# Patient Record
Sex: Female | Born: 2000 | Hispanic: Yes | Smoking: Never smoker
Health system: Southern US, Community
[De-identification: ages and names within clinical notes are randomized; demographics above are authoritative.]

## PROBLEM LIST (undated history)

## (undated) DIAGNOSIS — Z975 Presence of (intrauterine) contraceptive device: Secondary | ICD-10-CM

## (undated) DIAGNOSIS — Z3046 Encounter for surveillance of implantable subdermal contraceptive: Secondary | ICD-10-CM

## (undated) DIAGNOSIS — N898 Other specified noninflammatory disorders of vagina: Secondary | ICD-10-CM

## (undated) DIAGNOSIS — N926 Irregular menstruation, unspecified: Secondary | ICD-10-CM

## (undated) DIAGNOSIS — N930 Postcoital and contact bleeding: Secondary | ICD-10-CM

## (undated) DIAGNOSIS — J45909 Unspecified asthma, uncomplicated: Secondary | ICD-10-CM

## (undated) DIAGNOSIS — Z30017 Encounter for initial prescription of implantable subdermal contraceptive: Secondary | ICD-10-CM

## (undated) DIAGNOSIS — O139 Gestational [pregnancy-induced] hypertension without significant proteinuria, unspecified trimester: Secondary | ICD-10-CM

## (undated) DIAGNOSIS — N760 Acute vaginitis: Secondary | ICD-10-CM

## (undated) DIAGNOSIS — L309 Dermatitis, unspecified: Secondary | ICD-10-CM

## (undated) DIAGNOSIS — L0292 Furuncle, unspecified: Secondary | ICD-10-CM

## (undated) DIAGNOSIS — B9689 Other specified bacterial agents as the cause of diseases classified elsewhere: Secondary | ICD-10-CM

## (undated) DIAGNOSIS — T1490XA Injury, unspecified, initial encounter: Secondary | ICD-10-CM

## (undated) DIAGNOSIS — Z309 Encounter for contraceptive management, unspecified: Secondary | ICD-10-CM

## (undated) DIAGNOSIS — A749 Chlamydial infection, unspecified: Secondary | ICD-10-CM

## (undated) HISTORY — DX: Chlamydial infection, unspecified: A74.9

## (undated) HISTORY — DX: Encounter for surveillance of implantable subdermal contraceptive: Z30.46

## (undated) HISTORY — DX: Furuncle, unspecified: L02.92

## (undated) HISTORY — DX: Irregular menstruation, unspecified: N92.6

## (undated) HISTORY — DX: Encounter for initial prescription of implantable subdermal contraceptive: Z30.017

## (undated) HISTORY — DX: Postcoital and contact bleeding: N93.0

## (undated) HISTORY — DX: Other specified noninflammatory disorders of vagina: N89.8

## (undated) HISTORY — PX: APPENDECTOMY: SHX54

## (undated) HISTORY — DX: Encounter for contraceptive management, unspecified: Z30.9

## (undated) HISTORY — DX: Presence of (intrauterine) contraceptive device: Z97.5

## (undated) HISTORY — DX: Gestational (pregnancy-induced) hypertension without significant proteinuria, unspecified trimester: O13.9

## (undated) HISTORY — DX: Other specified bacterial agents as the cause of diseases classified elsewhere: B96.89

## (undated) HISTORY — DX: Injury, unspecified, initial encounter: T14.90XA

## (undated) HISTORY — DX: Acute vaginitis: N76.0

---
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
## 2018-01-20 ENCOUNTER — Ambulatory Visit (INDEPENDENT_AMBULATORY_CARE_PROVIDER_SITE_OTHER): Payer: Medicaid Other | Admitting: Advanced Practice Midwife

## 2018-01-20 ENCOUNTER — Encounter: Payer: Self-pay | Admitting: Advanced Practice Midwife

## 2018-01-20 ENCOUNTER — Other Ambulatory Visit: Payer: Medicaid Other

## 2018-01-20 VITALS — BP 122/81 | HR 99 | Wt 182.0 lb

## 2018-01-20 DIAGNOSIS — Z3A27 27 weeks gestation of pregnancy: Secondary | ICD-10-CM | POA: Diagnosis not present

## 2018-01-20 DIAGNOSIS — Z3402 Encounter for supervision of normal first pregnancy, second trimester: Secondary | ICD-10-CM

## 2018-01-20 DIAGNOSIS — Z23 Encounter for immunization: Secondary | ICD-10-CM

## 2018-01-20 DIAGNOSIS — Z331 Pregnant state, incidental: Secondary | ICD-10-CM

## 2018-01-20 DIAGNOSIS — Z1389 Encounter for screening for other disorder: Secondary | ICD-10-CM

## 2018-01-20 LAB — POCT URINALYSIS DIPSTICK OB
Blood, UA: NEGATIVE
Glucose, UA: NEGATIVE
Ketones, UA: NEGATIVE
Leukocytes, UA: NEGATIVE
Nitrite, UA: NEGATIVE

## 2018-01-20 NOTE — Patient Instructions (Signed)
Leah Carroll, I greatly value your feedback.  If you receive a survey following your visit with us today, we appreciate you taking the time to fill it out.  Thanks, Cathie BeamsFran Cresenzo-Dishmon, CNM   Call the office [phone]) or go to [facility] if:  You begin to have strong, frequent contractions  Your water breaks.  Sometimes it is a big gush of fluid, sometimes it is just a trickle that keeps getting your panties wet or running down your legs  You have vaginal bleeding.  It is normal to have a small amount of spotting if your cervix was checked.   You don't feel your baby moving like normal.  If you don't, get you something to eat and drink and lay down and focus on feeling your baby move.  You should feel at least 10 movements in 2 hours.  If you don't, you should call the office or go to [facility].    Tdap Vaccine  It is recommended that you get the Tdap vaccine during the third trimester of EACH pregnancy to help protect your baby from getting pertussis (whooping cough)  27-36 weeks is the BEST time to do this so that you can pass the protection on to your baby. During pregnancy is better than after pregnancy, but if you are unable to get it during pregnancy it will be offered at the hospital.   You will be offered this vaccine in the office after 27 weeks. If you do not have health insurance, you can get this vaccine at the health department or your family doctor  Everyone who will be around your baby should also be up-to-date on their vaccines. Adults (who are not pregnant) only need 1 dose of Tdap during adulthood.   Third Trimester of Pregnancy The third trimester is from week 29 through week 42, months 7 through 9. The third trimester is a time when the fetus is growing rapidly. At the end of the ninth month, the fetus is about 20 inches in length and weighs 6-10 pounds.  BODY CHANGES Your body goes through many changes during pregnancy. The changes vary from woman  to woman.   Your weight will continue to increase. You can expect to gain 25-35 pounds (11-16 kg) by the end of the pregnancy.  You may begin to get stretch marks on your hips, abdomen, and breasts.  You may urinate more often because the fetus is moving lower into your pelvis and pressing on your bladder.  You may develop or continue to have heartburn as a result of your pregnancy.  You may develop constipation because certain hormones are causing the muscles that push waste through your intestines to slow down.  You may develop hemorrhoids or swollen, bulging veins (varicose veins).  You may have pelvic pain because of the weight gain and pregnancy hormones relaxing your joints between the bones in your pelvis. Backaches may result from overexertion of the muscles supporting your posture.  You may have changes in your hair. These can include thickening of your hair, rapid growth, and changes in texture. Some women also have hair loss during or after pregnancy, or hair that feels dry or thin. Your hair will most likely return to normal after your baby is born.  Your breasts will continue to grow and be tender. A yellow discharge may leak from your breasts called colostrum.  Your belly button may stick out.  You may feel short of breath because of your expanding uterus.  You may notice the fetus "dropping," or moving lower in your abdomen.  You may have a bloody mucus discharge. This usually occurs a few days to a week before labor begins.  Your cervix becomes thin and soft (effaced) near your due date. WHAT TO EXPECT AT YOUR PRENATAL EXAMS  You will have prenatal exams every 2 weeks until week 36. Then, you will have weekly prenatal exams. During a routine prenatal visit:  You will be weighed to make sure you and the fetus are growing normally.  Your blood pressure is taken.  Your abdomen will be measured to track your baby's growth.  The fetal heartbeat will be listened  to.  Any test results from the previous visit will be discussed.  You may have a cervical check near your due date to see if you have effaced. At around 36 weeks, your caregiver will check your cervix. At the same time, your caregiver will also perform a test on the secretions of the vaginal tissue. This test is to determine if a type of bacteria, Group B streptococcus, is present. Your caregiver will explain this further. Your caregiver may ask you:  What your birth plan is.  How you are feeling.  If you are feeling the baby move.  If you have had any abnormal symptoms, such as leaking fluid, bleeding, severe headaches, or abdominal cramping.  If you have any questions. Other tests or screenings that may be performed during your third trimester include:  Blood tests that check for low iron levels (anemia).  Fetal testing to check the health, activity level, and growth of the fetus. Testing is done if you have certain medical conditions or if there are problems during the pregnancy. FALSE LABOR You may feel small, irregular contractions that eventually go away. These are called Braxton Hicks contractions, or false labor. Contractions may last for hours, days, or even weeks before true labor sets in. If contractions come at regular intervals, intensify, or become painful, it is best to be seen by your caregiver.  SIGNS OF LABOR   Menstrual-like cramps.  Contractions that are 5 minutes apart or less.  Contractions that start on the top of the uterus and spread down to the lower abdomen and back.  A sense of increased pelvic pressure or back pain.  A watery or bloody mucus discharge that comes from the vagina. If you have any of these signs before the 37th week of pregnancy, call your caregiver right away. You need to go to the hospital to get checked immediately. HOME CARE INSTRUCTIONS   Avoid all smoking, herbs, alcohol, and unprescribed drugs. These chemicals affect the  formation and growth of the baby.  Follow your caregiver's instructions regarding medicine use. There are medicines that are either safe or unsafe to take during pregnancy.  Exercise only as directed by your caregiver. Experiencing uterine cramps is a good sign to stop exercising.  Continue to eat regular, healthy meals.  Wear a good support bra for breast tenderness.  Do not use hot tubs, steam rooms, or saunas.  Wear your seat belt at all times when driving.  Avoid raw meat, uncooked cheese, cat litter boxes, and soil used by cats. These carry germs that can cause birth defects in the baby.  Take your prenatal vitamins.  Try taking a stool softener (if your caregiver approves) if you develop constipation. Eat more high-fiber foods, such as fresh vegetables or fruit and whole grains. Drink plenty of fluids to keep your urine  clear or pale yellow.  Take warm sitz baths to soothe any pain or discomfort caused by hemorrhoids. Use hemorrhoid cream if your caregiver approves.  If you develop varicose veins, wear support hose. Elevate your feet for 15 minutes, 3-4 times a day. Limit salt in your diet.  Avoid heavy lifting, wear low heal shoes, and practice good posture.  Rest a lot with your legs elevated if you have leg cramps or low back pain.  Visit your dentist if you have not gone during your pregnancy. Use a soft toothbrush to brush your teeth and be gentle when you floss.  A sexual relationship may be continued unless your caregiver directs you otherwise.  Do not travel far distances unless it is absolutely necessary and only with the approval of your caregiver.  Take prenatal classes to understand, practice, and ask questions about the labor and delivery.  Make a trial run to the hospital.  Pack your hospital bag.  Prepare the baby's nursery.  Continue to go to all your prenatal visits as directed by your caregiver. SEEK MEDICAL CARE IF:  You are unsure if you are in  labor or if your water has broken.  You have dizziness.  You have mild pelvic cramps, pelvic pressure, or nagging pain in your abdominal area.  You have persistent nausea, vomiting, or diarrhea.  You have a bad smelling vaginal discharge.  You have pain with urination. SEEK IMMEDIATE MEDICAL CARE IF:   You have a fever.  You are leaking fluid from your vagina.  You have spotting or bleeding from your vagina.  You have severe abdominal cramping or pain.  You have rapid weight loss or gain.  You have shortness of breath with chest pain.  You notice sudden or extreme swelling of your face, hands, ankles, feet, or legs.  You have not felt your baby move in over an hour.  You have severe headaches that do not go away with medicine.  You have vision changes. Document Released: 01/21/2001 Document Revised: 02/01/2013 Document Reviewed: 03/30/2012 [facility] Patient Information 2015 [place], [place]. This information is not intended to replace advice given to you by your health care provider. Make sure you discuss any questions you have with your health care provider.

## 2018-01-20 NOTE — Progress Notes (Signed)
  G1P0000 1048w1d Estimated Date of Delivery: 04/20/18  Blood pressure 122/81, pulse 99, weight 182 lb (82.6 kg), last menstrual period 06/29/2017.   BP weight and urine results all reviewed and noted.  Please refer to the obstetrical flow sheet for the fundal height and fetal heart rate documentation:  Patient reports good fetal movement, denies any bleeding and no rupture of membranes symptoms or regular contractions. Patient is without complaints. All questions were answered.   Physical Assessment:   Vitals:   01/20/18 0904  BP: 122/81  Pulse: 99  Weight: 182 lb (82.6 kg)  Body mass index is 36.76 kg/m.        Physical Examination:   General appearance: Well appearing, and in no distress  Mental status: Alert, oriented to person, place, and time  Skin: Warm & dry  Cardiovascular: Normal heart rate noted  Respiratory: Normal respiratory effort, no distress  Abdomen: Soft, gravid, nontender  Pelvic: Cervical exam deferred         Extremities: Edema: None  Fetal Status: Fetal Heart Rate (bpm): 140 Fundal Height: 27 cm Movement: Present    Results for orders placed or performed in visit on 01/20/18 (from the past 24 hour(s))  POC Urinalysis Dipstick OB   Collection Time: 01/20/18  9:10 AM  Result Value Ref Range   Color, UA     Clarity, UA     Glucose, UA Negative Negative   Bilirubin, UA     Ketones, UA neg    Spec Grav, UA     Blood, UA neg    pH, UA     POC,PROTEIN,UA Trace Negative, Trace, Small (1+), Moderate (2+), Large (3+), 4+   Urobilinogen, UA     Nitrite, UA neg    Leukocytes, UA Negative Negative   Appearance     Odor       Orders Placed This Encounter  Procedures  . Tdap vaccine greater than or equal to 7yo IM  . POC Urinalysis Dipstick OB    Plan:  Continued routine obstetrical care, PN2 today   Return in about 3 weeks (around 02/10/2018) for LROB.

## 2018-01-21 LAB — HIV ANTIBODY (ROUTINE TESTING W REFLEX): HIV SCREEN 4TH GENERATION: NONREACTIVE

## 2018-01-21 LAB — CBC
HEMOGLOBIN: 12.1 g/dL (ref 11.1–15.9)
Hematocrit: 37.1 % (ref 34.0–46.6)
MCH: 28.7 pg (ref 26.6–33.0)
MCHC: 32.6 g/dL (ref 31.5–35.7)
MCV: 88 fL (ref 79–97)
PLATELETS: 308 10*3/uL (ref 150–450)
RBC: 4.22 x10E6/uL (ref 3.77–5.28)
RDW: 11.9 % — ABNORMAL LOW (ref 12.3–15.4)
WBC: 9.1 10*3/uL (ref 3.4–10.8)

## 2018-01-21 LAB — GLUCOSE TOLERANCE, 2 HOURS W/ 1HR
GLUCOSE, 2 HOUR: 109 mg/dL (ref 65–152)
Glucose, 1 hour: 131 mg/dL (ref 65–179)
Glucose, Fasting: 85 mg/dL (ref 65–91)

## 2018-01-21 LAB — RPR: RPR Ser Ql: NONREACTIVE

## 2018-01-21 LAB — ANTIBODY SCREEN: Antibody Screen: NEGATIVE

## 2018-02-10 NOTE — L&D Delivery Note (Signed)
OB/GYN Faculty Practice Delivery Note  VANEZZA CORR is a 18 y.o. G1P0000 s/p SVD at [redacted]w[redacted]d. She was admitted for IOL for gHTN.   ROM: unclear timing GBS Status: positive - adequate treatment Maximum Maternal Temperature: Temp (48hrs), Avg:97.9 F (36.6 C), Min:97.6 F (36.4 C), Max:98.2 F (36.8 C)  Labor Progress: . Induction started with FB and cytotec (2 doses) . Progressed to complete . Pushed for a little over 2 hours, using nitrous   Delivery Date/Time: 04/10/18 at 2253 Delivery: Called to room and patient was complete and pushing. Head delivered ROP. No nuchal cord present. Shoulder and body delivered in usual fashion. Infant with spontaneous cry, placed on mother's abdomen, dried and stimulated. Cord clamped x 2 after 1-minute delay, and cut by father of baby. Cord blood drawn. Placenta delivered spontaneously with gentle cord traction. Fundus firm with massage and Pitocin but 2+ above umbilicus. Labia, perineum, vagina, and cervix inspected inspected with small sub-centimeter sulcal laceration.   Placenta: spontaneous, intact, 3-vessel cord Complications: Postpartum hemorrhage - QBL total 1256 + unweighed sheets estimating EBL at 1300cc  Received cytotec and TXA initially then lower-uterine sweep with removal of several small clots  persistent trickle and clots with coughing, received methergine and 2nd dose of TXA  2nd lower uterine sweet by Dr. Vergie Living with again removal of small clots  uterus firm and at level of umbilicus  BSUS without clot in lower uterine segment, thin strip near fundus  foley placed, Ancef given and will continue IVF at 75cc/hr  VSS  Will repeat CBC, CMP and DIC panel at 0500 Lacerations: small sulcal repaired with 3-0 Monocryl EBL: 1300cc Analgesia: nitrous   Postpartum Planning [x]  message to sent to schedule follow-up  [x]  vaccines UTD  Infant: Vigorous female  APGARs 7, 76  3405g  Nancy Arvin S. Earlene Plater, DO OB/GYN Fellow, Faculty Practice

## 2018-02-11 ENCOUNTER — Encounter: Payer: Self-pay | Admitting: Advanced Practice Midwife

## 2018-02-11 ENCOUNTER — Ambulatory Visit (INDEPENDENT_AMBULATORY_CARE_PROVIDER_SITE_OTHER): Payer: Medicaid Other | Admitting: Advanced Practice Midwife

## 2018-02-11 VITALS — BP 108/68 | HR 87 | Wt 188.0 lb

## 2018-02-11 DIAGNOSIS — Z3403 Encounter for supervision of normal first pregnancy, third trimester: Secondary | ICD-10-CM

## 2018-02-11 DIAGNOSIS — O360130 Maternal care for anti-D [Rh] antibodies, third trimester, not applicable or unspecified: Secondary | ICD-10-CM | POA: Diagnosis not present

## 2018-02-11 DIAGNOSIS — Z3A30 30 weeks gestation of pregnancy: Secondary | ICD-10-CM

## 2018-02-11 DIAGNOSIS — Z1389 Encounter for screening for other disorder: Secondary | ICD-10-CM

## 2018-02-11 DIAGNOSIS — Z331 Pregnant state, incidental: Secondary | ICD-10-CM

## 2018-02-11 LAB — POCT URINALYSIS DIPSTICK OB
Glucose, UA: NEGATIVE
Ketones, UA: NEGATIVE
Leukocytes, UA: NEGATIVE
NITRITE UA: NEGATIVE
POC,PROTEIN,UA: NEGATIVE
RBC UA: NEGATIVE

## 2018-02-11 MED ORDER — RHO D IMMUNE GLOBULIN 1500 UNIT/2ML IJ SOSY
300.0000 ug | PREFILLED_SYRINGE | Freq: Once | INTRAMUSCULAR | Status: AC
  Administered 2018-02-11: 300 ug via INTRAMUSCULAR

## 2018-02-11 NOTE — Patient Instructions (Signed)

## 2018-02-11 NOTE — Progress Notes (Signed)
  G1P0000 [redacted]w[redacted]d Estimated Date of Delivery: 04/20/18  Blood pressure 108/68, pulse 87, weight 188 lb (85.3 kg), last menstrual period 06/29/2017.   BP weight and urine results all reviewed and noted.  Please refer to the obstetrical flow sheet for the fundal height and fetal heart rate documentation:  Patient reports good fetal movement, denies any bleeding and no rupture of membranes symptoms or regular contractions. Patient is without complaints. Doesn't think birth control is good for your body. Explained that having babies back to back to back is not good for your body.  May consider Paragard IUD  All questions were answered.   Physical Assessment:   Vitals:   02/11/18 1100  BP: 108/68  Pulse: 87  Weight: 188 lb (85.3 kg)  Body mass index is 37.97 kg/m.        Physical Examination:   General appearance: Well appearing, and in no distress  Mental status: Alert, oriented to person, place, and time  Skin: Warm & dry  Cardiovascular: Normal heart rate noted  Respiratory: Normal respiratory effort, no distress  Abdomen: Soft, gravid, nontender  Pelvic: Cervical exam deferred         Extremities: Edema: None  Fetal Status:     Movement: Present    Results for orders placed or performed in visit on 02/11/18 (from the past 24 hour(s))  POC Urinalysis Dipstick OB   Collection Time: 02/11/18 11:05 AM  Result Value Ref Range   Color, UA     Clarity, UA     Glucose, UA Negative Negative   Bilirubin, UA     Ketones, UA neg    Spec Grav, UA     Blood, UA neg    pH, UA     POC,PROTEIN,UA Negative Negative, Trace, Small (1+), Moderate (2+), Large (3+), 4+   Urobilinogen, UA     Nitrite, UA neg    Leukocytes, UA Negative Negative   Appearance     Odor       Orders Placed This Encounter  Procedures  . POC Urinalysis Dipstick OB    Plan:  Continued routine obstetrical care, rhogam today  Return in about 2 weeks (around 02/25/2018) for LROB.

## 2018-02-25 ENCOUNTER — Ambulatory Visit (INDEPENDENT_AMBULATORY_CARE_PROVIDER_SITE_OTHER): Payer: Medicaid Other | Admitting: Advanced Practice Midwife

## 2018-02-25 ENCOUNTER — Encounter: Payer: Self-pay | Admitting: Advanced Practice Midwife

## 2018-02-25 VITALS — BP 128/74 | HR 87 | Wt 188.0 lb

## 2018-02-25 DIAGNOSIS — Z3403 Encounter for supervision of normal first pregnancy, third trimester: Secondary | ICD-10-CM

## 2018-02-25 DIAGNOSIS — Z331 Pregnant state, incidental: Secondary | ICD-10-CM

## 2018-02-25 DIAGNOSIS — Z3A32 32 weeks gestation of pregnancy: Secondary | ICD-10-CM

## 2018-02-25 DIAGNOSIS — Z1389 Encounter for screening for other disorder: Secondary | ICD-10-CM

## 2018-02-25 LAB — POCT URINALYSIS DIPSTICK OB
Blood, UA: NEGATIVE
Glucose, UA: NEGATIVE
Ketones, UA: NEGATIVE
Leukocytes, UA: NEGATIVE
Nitrite, UA: NEGATIVE
POC,PROTEIN,UA: NEGATIVE

## 2018-02-25 NOTE — Patient Instructions (Signed)

## 2018-02-25 NOTE — Progress Notes (Signed)
  G1P0000 [redacted]w[redacted]d Estimated Date of Delivery: 04/20/18  Blood pressure 128/74, pulse 87, weight 188 lb (85.3 kg), last menstrual period 06/29/2017.   BP weight and urine results all reviewed and noted.  Please refer to the obstetrical flow sheet for the fundal height and fetal heart rate documentation:  Patient reports good fetal movement, denies any bleeding and no rupture of membranes symptoms or regular contractions. Patient is without complaints. All questions were answered.   Physical Assessment:   Vitals:   02/25/18 1336  BP: 128/74  Pulse: 87  Weight: 188 lb (85.3 kg)  Body mass index is 37.97 kg/m.        Physical Examination:   General appearance: Well appearing, and in no distress  Mental status: Alert, oriented to person, place, and time  Skin: Warm & dry  Cardiovascular: Normal heart rate noted  Respiratory: Normal respiratory effort, no distress  Abdomen: Soft, gravid, nontender  Pelvic: Cervical exam deferred         Extremities: Edema: None  Fetal Status: Fetal Heart Rate (bpm): 130 Fundal Height: 33 cm Movement: Present    Results for orders placed or performed in visit on 02/25/18 (from the past 24 hour(s))  POC Urinalysis Dipstick OB   Collection Time: 02/25/18  1:40 PM  Result Value Ref Range   Color, UA     Clarity, UA     Glucose, UA Negative Negative   Bilirubin, UA     Ketones, UA neg    Spec Grav, UA     Blood, UA neg    pH, UA     POC,PROTEIN,UA Negative Negative, Trace, Small (1+), Moderate (2+), Large (3+), 4+   Urobilinogen, UA     Nitrite, UA neg    Leukocytes, UA Negative Negative   Appearance     Odor       Orders Placed This Encounter  Procedures  . POC Urinalysis Dipstick OB    Plan:  Continued routine obstetrical care,   Return in about 2 weeks (around 03/11/2018) for LROB.

## 2018-03-11 ENCOUNTER — Encounter: Payer: Self-pay | Admitting: Advanced Practice Midwife

## 2018-03-11 ENCOUNTER — Ambulatory Visit (INDEPENDENT_AMBULATORY_CARE_PROVIDER_SITE_OTHER): Payer: Medicaid Other | Admitting: Advanced Practice Midwife

## 2018-03-11 VITALS — BP 128/75 | HR 100 | Wt 193.0 lb

## 2018-03-11 DIAGNOSIS — O26843 Uterine size-date discrepancy, third trimester: Secondary | ICD-10-CM

## 2018-03-11 DIAGNOSIS — Z3403 Encounter for supervision of normal first pregnancy, third trimester: Secondary | ICD-10-CM

## 2018-03-11 DIAGNOSIS — Z3A34 34 weeks gestation of pregnancy: Secondary | ICD-10-CM

## 2018-03-11 DIAGNOSIS — Z331 Pregnant state, incidental: Secondary | ICD-10-CM

## 2018-03-11 DIAGNOSIS — Z1389 Encounter for screening for other disorder: Secondary | ICD-10-CM

## 2018-03-11 LAB — POCT URINALYSIS DIPSTICK OB
Blood, UA: NEGATIVE
Glucose, UA: NEGATIVE
Ketones, UA: NEGATIVE
Leukocytes, UA: NEGATIVE
Nitrite, UA: NEGATIVE

## 2018-03-11 MED ORDER — OMEPRAZOLE 20 MG PO CPDR: 20.0000 mg | DELAYED_RELEASE_CAPSULE | Freq: Every day | ORAL | 6 refills | Status: DC

## 2018-03-11 NOTE — Patient Instructions (Signed)

## 2018-03-11 NOTE — Progress Notes (Signed)
  G1P0000 [redacted]w[redacted]d Estimated Date of Delivery: 04/20/18  Blood pressure 128/75, pulse 100, weight 193 lb (87.5 kg), last menstrual period 06/29/2017.   BP weight and urine results all reviewed and noted.  Please refer to the obstetrical flow sheet for the fundal height and fetal heart rate documentation:  Patient reports good fetal movement, denies any bleeding and no rupture of membranes symptoms or regular contractions. Patient has some reflux at night All questions were answered.   Physical Assessment:   Vitals:   03/11/18 1443  BP: 128/75  Pulse: 100  Weight: 193 lb (87.5 kg)  Body mass index is 38.98 kg/m.        Physical Examination:   General appearance: Well appearing, and in no distress  Mental status: Alert, oriented to person, place, and time  Skin: Warm & dry  Cardiovascular: Normal heart rate noted  Respiratory: Normal respiratory effort, no distress  Abdomen: Soft, gravid, nontender  Pelvic: Cervical exam deferred         Extremities: Edema: None  Fetal Status: Fetal Heart Rate (bpm): 137 Fundal Height: 37 cm Movement: Present    Results for orders placed or performed in visit on 03/11/18 (from the past 24 hour(s))  POC Urinalysis Dipstick OB   Collection Time: 03/11/18  2:50 PM  Result Value Ref Range   Color, UA     Clarity, UA     Glucose, UA Negative Negative   Bilirubin, UA     Ketones, UA neg    Spec Grav, UA     Blood, UA neg    pH, UA     POC,PROTEIN,UA Trace Negative, Trace, Small (1+), Moderate (2+), Large (3+), 4+   Urobilinogen, UA     Nitrite, UA neg    Leukocytes, UA Negative Negative   Appearance     Odor       Orders Placed This Encounter  Procedures  . US OB Follow Up  . POC Urinalysis Dipstick OB    Plan:  Continued routine obstetrical care,        rx prilosec                           Size>dates  Return in about 1 week (around 03/18/2018) for LROB, US:EFW.

## 2018-03-18 ENCOUNTER — Ambulatory Visit (INDEPENDENT_AMBULATORY_CARE_PROVIDER_SITE_OTHER): Payer: Medicaid Other

## 2018-03-18 ENCOUNTER — Ambulatory Visit (INDEPENDENT_AMBULATORY_CARE_PROVIDER_SITE_OTHER): Payer: Medicaid Other | Admitting: Advanced Practice Midwife

## 2018-03-18 ENCOUNTER — Ambulatory Visit: Payer: Medicaid Other | Admitting: Family Medicine

## 2018-03-18 VITALS — BP 133/80 | HR 89 | Wt 193.0 lb

## 2018-03-18 DIAGNOSIS — Z3403 Encounter for supervision of normal first pregnancy, third trimester: Secondary | ICD-10-CM

## 2018-03-18 DIAGNOSIS — O26843 Uterine size-date discrepancy, third trimester: Secondary | ICD-10-CM | POA: Diagnosis not present

## 2018-03-18 DIAGNOSIS — Z1389 Encounter for screening for other disorder: Secondary | ICD-10-CM

## 2018-03-18 DIAGNOSIS — Z331 Pregnant state, incidental: Secondary | ICD-10-CM

## 2018-03-18 DIAGNOSIS — Z3A35 35 weeks gestation of pregnancy: Secondary | ICD-10-CM

## 2018-03-18 LAB — POCT URINALYSIS DIPSTICK OB
Blood, UA: NEGATIVE
GLUCOSE, UA: NEGATIVE
Ketones, UA: NEGATIVE
LEUKOCYTES UA: NEGATIVE
Nitrite, UA: NEGATIVE
POC,PROTEIN,UA: NEGATIVE

## 2018-03-18 NOTE — Progress Notes (Signed)
  G1P0000 [redacted]w[redacted]d Estimated Date of Delivery: 04/20/18  Blood pressure 133/80, pulse 89, weight 193 lb (87.5 kg), last menstrual period 06/29/2017.   BP weight and urine results all reviewed and noted.  Please refer to the obstetrical flow sheet for the fundal height and fetal heart rate documentation:  Patient reports good fetal movement, denies any bleeding and no rupture of membranes symptoms or regular contractions. Patient is without complaints. All questions were answered.   Physical Assessment:   Vitals:   03/18/18 1406  BP: 133/80  Pulse: 89  Weight: 193 lb (87.5 kg)  Body mass index is 38.98 kg/m.        Physical Examination:   General appearance: Well appearing, and in no distress  Mental status: Alert, oriented to person, place, and time  Skin: Warm & dry  Cardiovascular: Normal heart rate noted  Respiratory: Normal respiratory effort, no distress  Abdomen: Soft, gravid, nontender  Pelvic: Cervical exam deferred         Extremities: Edema: None  Fetal Status: Fetal Heart Rate (bpm): [place]   Movement: Present   [place] 35+2 wks,cephalic,fhr 130 bpm,posterior placenta gr 3,afi 15 cm,efw 2966 g 81% AC 93%  Results for orders placed or performed in visit on 03/18/18 (from the past 24 hour(s))  POC Urinalysis Dipstick OB   Collection Time: 03/18/18  2:03 PM  Result Value Ref Range   Color, UA     Clarity, UA     Glucose, UA Negative Negative   Bilirubin, UA     Ketones, UA neg    Spec Grav, UA     Blood, UA neg    pH, UA     POC,PROTEIN,UA Negative Negative, Trace, Small (1+), Moderate (2+), Large (3+), 4+   Urobilinogen, UA     Nitrite, UA neg    Leukocytes, UA Negative Negative   Appearance     Odor       Orders Placed This Encounter  Procedures  . POC Urinalysis Dipstick OB    Plan:  Continued routine obstetrical care,   Return in about 2 weeks (around 04/01/2018) for LROB.

## 2018-03-18 NOTE — Progress Notes (Signed)
[place] 35+2 wks,cephalic,fhr 130 bpm,posterior placenta gr 3,afi 15 cm,efw 2966 g 81% AC 93%

## 2018-03-19 ENCOUNTER — Encounter: Payer: Medicaid Other | Admitting: Obstetrics & Gynecology

## 2018-03-19 ENCOUNTER — Other Ambulatory Visit: Payer: Medicaid Other

## 2018-03-24 ENCOUNTER — Encounter: Payer: Self-pay | Admitting: Pediatrics

## 2018-03-30 ENCOUNTER — Other Ambulatory Visit: Payer: Self-pay | Admitting: Pediatrics

## 2018-03-30 DIAGNOSIS — L209 Atopic dermatitis, unspecified: Secondary | ICD-10-CM

## 2018-04-01 ENCOUNTER — Ambulatory Visit (INDEPENDENT_AMBULATORY_CARE_PROVIDER_SITE_OTHER): Payer: Medicaid Other | Admitting: Family Medicine

## 2018-04-01 ENCOUNTER — Encounter: Payer: Self-pay | Admitting: Family Medicine

## 2018-04-01 ENCOUNTER — Other Ambulatory Visit: Payer: Medicaid Other

## 2018-04-01 VITALS — BP 149/84 | HR 129 | Wt 196.6 lb

## 2018-04-01 DIAGNOSIS — O26899 Other specified pregnancy related conditions, unspecified trimester: Secondary | ICD-10-CM

## 2018-04-01 DIAGNOSIS — Z6791 Unspecified blood type, Rh negative: Secondary | ICD-10-CM

## 2018-04-01 DIAGNOSIS — Z331 Pregnant state, incidental: Secondary | ICD-10-CM

## 2018-04-01 DIAGNOSIS — O163 Unspecified maternal hypertension, third trimester: Secondary | ICD-10-CM

## 2018-04-01 DIAGNOSIS — Z3403 Encounter for supervision of normal first pregnancy, third trimester: Secondary | ICD-10-CM | POA: Diagnosis not present

## 2018-04-01 DIAGNOSIS — Z3A27 27 weeks gestation of pregnancy: Secondary | ICD-10-CM | POA: Diagnosis not present

## 2018-04-01 DIAGNOSIS — Z1389 Encounter for screening for other disorder: Secondary | ICD-10-CM

## 2018-04-01 DIAGNOSIS — B001 Herpesviral vesicular dermatitis: Secondary | ICD-10-CM

## 2018-04-01 NOTE — Patient Instructions (Signed)
Come to the MAU (maternity admission unit) for 1) Strong contractions every 2-3 minutes for at least 1 hour that do not go away when you drink water or take a warm shower. These contractions will be so strong all you can do is breath through them 2) Vaginal bleeding- anything more than spotting 3) Loss of fluid like you broke your water 4) Decreased movement of your baby  

## 2018-04-01 NOTE — Progress Notes (Signed)
    PRENATAL VISIT NOTE  Subjective:  Leah Carroll is a 18 y.o. G1P0000 at [redacted]w[redacted]d being seen today for ongoing prenatal care.  She is currently monitored for the following issues for this low-risk pregnancy and has Herpes labialis; Exercise-induced asthma; Flexural atopic dermatitis; Supervision of normal first pregnancy; and Rh negative state in antepartum period on their problem list.  Patient reports no complaints.  Contractions: Not present. Vag. Bleeding: None.  Movement: Present. Denies leaking of fluid.   The following portions of the patient's history were reviewed and updated as appropriate: allergies, current medications, past family history, past medical history, past social history, past surgical history and problem list. Problem list updated.  Objective:   Vitals:   04/01/18 1140 04/01/18 1143  BP: 140/87 (!) 149/84  Pulse: (!) 129   Weight: 196 lb 9.6 oz (89.2 kg)     Fetal Status: Fetal Heart Rate (bpm): 150 Fundal Height: 38 cm Movement: Present     General:  Alert, oriented and cooperative. Patient is in no acute distress.  Skin: Skin is warm and dry. No rash noted.   Cardiovascular: Normal heart rate noted  Respiratory: Normal respiratory effort, no problems with respiration noted  Abdomen: Soft, gravid, appropriate for gestational age.  Pain/Pressure: Present     Pelvic: Cervical exam deferred        Extremities: Normal range of motion.  Edema: Trace  Mental Status: Normal mood and affect. Normal behavior. Normal judgment and thought content.   Assessment and Plan:  Pregnancy: G1P0000 at [redacted]w[redacted]d  1. Encounter for supervision of normal first pregnancy in third trimester Up to date, except flu vaccination - Culture, beta strep (group b only) - GC/Chlamydia Probe Amp  2. Rh negative state in antepartum period Received rhogam  3. Herpes labialis Has been on problem list, patient left before asking about if she has ever had genital lesions. Per review of  epic has only been prescribed acyclovir cream for oral lesion. Recommend assuring patient has not had vaginal lesions in the past.   4. Screening for genitourinary condition - POC Urinalysis Dipstick OB  5. Elevated blood pressure affecting pregnancy in third trimester, antepartum BP is above her baseline today and was repeat and still SBP was >140. Discussed s/sx of preeclampsia. Patient denies HA, changes in vision, RUQ pain, edema. Udip with no protein today. Will get MCP, UPC, and CBC. Plan for BP check tomorrow. If >140/90 patient would meet criteria for gestational hypertension and would recommend IOL     Term labor symptoms and general obstetric precautions including but not limited to vaginal bleeding, contractions, leaking of fluid and fetal movement were reviewed in detail with the patient. Please refer to After Visit Summary for other counseling recommendations.  Return in about 1 day (around 04/02/2018) for Blood pressure check.  Future Appointments  Date Time Provider Department Center  04/01/2018  2:30 PM [facility] LAB [facility]  04/02/2018 10:45 AM [facility] NURSE TECH [facility]    Federico Flake, MD

## 2018-04-02 ENCOUNTER — Ambulatory Visit (INDEPENDENT_AMBULATORY_CARE_PROVIDER_SITE_OTHER): Payer: Medicaid Other | Admitting: Obstetrics & Gynecology

## 2018-04-02 ENCOUNTER — Encounter: Payer: Self-pay | Admitting: Obstetrics & Gynecology

## 2018-04-02 ENCOUNTER — Ambulatory Visit: Payer: Medicaid Other | Admitting: Obstetrics and Gynecology

## 2018-04-02 VITALS — BP 132/86 | HR 132 | Wt 197.6 lb

## 2018-04-02 VITALS — BP 132/86 | HR 132 | Temp 98.4°F | Ht 59.0 in | Wt 197.6 lb

## 2018-04-02 DIAGNOSIS — Z331 Pregnant state, incidental: Secondary | ICD-10-CM

## 2018-04-02 DIAGNOSIS — J0100 Acute maxillary sinusitis, unspecified: Secondary | ICD-10-CM | POA: Diagnosis not present

## 2018-04-02 DIAGNOSIS — Z0130 Encounter for examination of blood pressure without abnormal findings: Secondary | ICD-10-CM

## 2018-04-02 DIAGNOSIS — Z1389 Encounter for screening for other disorder: Secondary | ICD-10-CM

## 2018-04-02 LAB — POCT URINALYSIS DIPSTICK OB
Blood, UA: NEGATIVE
Glucose, UA: NEGATIVE
Ketones, UA: NEGATIVE
Nitrite, UA: NEGATIVE
POC,PROTEIN,UA: NEGATIVE

## 2018-04-02 LAB — PROTEIN / CREATININE RATIO, URINE
Creatinine, Urine: 78.8 mg/dL
Protein, Ur: 14.1 mg/dL
Protein/Creat Ratio: 179 mg/g creat (ref 0–200)

## 2018-04-02 LAB — COMPREHENSIVE METABOLIC PANEL
ALK PHOS: 165 IU/L — AB (ref 39–117)
ALT: 12 IU/L (ref 0–32)
AST: 16 IU/L (ref 0–40)
Albumin/Globulin Ratio: 1.2 (ref 1.2–2.2)
Albumin: 3.3 g/dL — ABNORMAL LOW (ref 3.9–5.0)
BUN/Creatinine Ratio: 7 — ABNORMAL LOW (ref 9–23)
BUN: 4 mg/dL — ABNORMAL LOW (ref 6–20)
Bilirubin Total: 0.3 mg/dL (ref 0.0–1.2)
CO2: 19 mmol/L — ABNORMAL LOW (ref 20–29)
Calcium: 9.3 mg/dL (ref 8.7–10.2)
Chloride: 101 mmol/L (ref 96–106)
Creatinine, Ser: 0.59 mg/dL (ref 0.57–1.00)
GFR calc Af Amer: 154 mL/min/{1.73_m2} (ref >59)
GFR calc non Af Amer: 133 mL/min/{1.73_m2} (ref >59)
Globulin, Total: 2.8 g/dL (ref 1.5–4.5)
Glucose: 95 mg/dL (ref 65–99)
POTASSIUM: 4.3 mmol/L (ref 3.5–5.2)
Sodium: 134 mmol/L (ref 134–144)
Total Protein: 6.1 g/dL (ref 6.0–8.5)

## 2018-04-02 LAB — CBC
Hematocrit: 35.5 % (ref 34.0–46.6)
Hemoglobin: 11.9 g/dL (ref 11.1–15.9)
MCH: 26.4 pg — ABNORMAL LOW (ref 26.6–33.0)
MCHC: 33.5 g/dL (ref 31.5–35.7)
MCV: 79 fL (ref 79–97)
Platelets: 228 10*3/uL (ref 150–450)
RBC: 4.5 x10E6/uL (ref 3.77–5.28)
RDW: 12.9 % (ref 11.7–15.4)
WBC: 7.2 10*3/uL (ref 3.4–10.8)

## 2018-04-02 MED ORDER — AZITHROMYCIN 250 MG PO TABS: ORAL_TABLET | ORAL | 0 refills | Status: DC
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
## 2018-04-03 LAB — GC/CHLAMYDIA PROBE AMP
Chlamydia trachomatis, NAA: NEGATIVE
Neisseria gonorrhoeae by PCR: NEGATIVE

## 2018-04-04 LAB — CULTURE, BETA STREP (GROUP B ONLY): Strep Gp B Culture: POSITIVE — AB

## 2018-04-07 ENCOUNTER — Encounter: Payer: Self-pay | Admitting: Family Medicine

## 2018-04-07 DIAGNOSIS — O99820 Streptococcus B carrier state complicating pregnancy: Secondary | ICD-10-CM | POA: Insufficient documentation

## 2018-04-08 ENCOUNTER — Other Ambulatory Visit (HOSPITAL_COMMUNITY): Payer: Self-pay | Admitting: Advanced Practice Midwife

## 2018-04-08 ENCOUNTER — Ambulatory Visit (INDEPENDENT_AMBULATORY_CARE_PROVIDER_SITE_OTHER): Payer: Medicaid Other | Admitting: Advanced Practice Midwife

## 2018-04-08 VITALS — BP 140/77 | HR 82 | Wt 199.0 lb

## 2018-04-08 DIAGNOSIS — Z1389 Encounter for screening for other disorder: Secondary | ICD-10-CM

## 2018-04-08 DIAGNOSIS — O163 Unspecified maternal hypertension, third trimester: Secondary | ICD-10-CM

## 2018-04-08 DIAGNOSIS — O139 Gestational [pregnancy-induced] hypertension without significant proteinuria, unspecified trimester: Secondary | ICD-10-CM | POA: Insufficient documentation

## 2018-04-08 DIAGNOSIS — Z331 Pregnant state, incidental: Secondary | ICD-10-CM

## 2018-04-08 DIAGNOSIS — Z3A38 38 weeks gestation of pregnancy: Secondary | ICD-10-CM

## 2018-04-08 LAB — POCT URINALYSIS DIPSTICK OB
Glucose, UA: NEGATIVE
Ketones, UA: NEGATIVE
LEUKOCYTES UA: NEGATIVE
Nitrite, UA: NEGATIVE
POC,PROTEIN,UA: NEGATIVE
RBC UA: NEGATIVE

## 2018-04-08 NOTE — Progress Notes (Signed)
  G1P0000 [redacted]w[redacted]d Estimated Date of Delivery: 04/20/18  Blood pressure 140/77, pulse 82, weight 199 lb (90.3 kg), last menstrual period 06/29/2017.   BP weight and urine results all reviewed and noted.  Takes BP at home and is "in the 120's"  Please refer to the obstetrical flow sheet for the fundal height and fetal heart rate documentation:  Patient reports good fetal movement, denies any bleeding and no rupture of membranes symptoms or regular contractions. Patient is without complaints. All questions were answered.   Physical Assessment:   Vitals:   04/08/18 1101  BP: 140/77  Pulse: 82  Weight: 199 lb (90.3 kg)  Body mass index is 40.19 kg/m.        Physical Examination:   General appearance: Well appearing, and in no distress  Mental status: Alert, oriented to person, place, and time  Skin: Warm & dry  Cardiovascular: Normal heart rate noted  Respiratory: Normal respiratory effort, no distress  Abdomen: Soft, gravid, nontender  Pelvic: Cervical exam performed  Dilation: Fingertip      Extremities: Edema: None  Fetal Status: Fetal Heart Rate (bpm): 127 Fundal Height: 40 cm Movement: Present Presentation: Vertex  Results for orders placed or performed in visit on 04/08/18 (from the past 24 hour(s))  POC Urinalysis Dipstick OB   Collection Time: 04/08/18 11:03 AM  Result Value Ref Range   Color, UA     Clarity, UA     Glucose, UA Negative Negative   Bilirubin, UA     Ketones, UA neg    Spec Grav, UA     Blood, UA neg    pH, UA     POC,PROTEIN,UA Negative Negative, Trace, Small (1+), Moderate (2+), Large (3+), 4+   Urobilinogen, UA     Nitrite, UA neg    Leukocytes, UA Negative Negative   Appearance     Odor       Orders Placed This Encounter  Procedures  . POC Urinalysis Dipstick OB    Plan:   IOL Saturday for GHTN. Orders in.  No follow-ups on file.

## 2018-04-08 NOTE — Progress Notes (Signed)
   Induction Assessment Scheduling Form: Fax to [facility]:  [phone]  Leah Carroll                                                                                   DOB:  02-14-2000                                                            MRN:  [id number]                                                                     Phone #:   [phone]                         Provider:  [facility]  GP:  G1P0000                                                            Estimated Date of Delivery: 04/20/18  Dating Criteria: early [place]    Medical Indications for induction:  GHTN Admission Date/Time:  02/29 0730 Gestational age on admission:  38.3   Filed Weights   04/08/18 1101  Weight: 199 lb (90.3 kg)   HIV:  Non Reactive (12/11 0850) GBS:  postitive  FT/thick/-2/vtx   Method of induction(proposed):  cytotec   Scheduling Provider Signature:  Jacklyn Shell, CNM                                            Today's Date:  04/08/2018

## 2018-04-08 NOTE — Patient Instructions (Signed)
  Cervical Ripening: May try one or both  Red Raspberry Leaf capsules:  two 300mg  or 400mg  tablets with each meal, 2-3 times a day  Potential Side Effects Of Raspberry Leaf:  Most women do not experience any side effects from drinking raspberry leaf tea. However, nausea and loose stools are possible     Evening Primrose Oil capsules: may take 1 to 3 capsules daily. May also prick one to release the oil and insert it into your vagina at night.  Some of the potential side effects:  Upset stomach  Loose stools or diarrhea  Headaches  Nausea:     If you are still pregnant on Saturday at 7:30 am, come to Maternity Admissions Unit  to start your induction!  Eat a light meal before you come.  Daisey Must!!

## 2018-04-09 ENCOUNTER — Encounter (HOSPITAL_COMMUNITY): Payer: Self-pay | Admitting: *Deleted

## 2018-04-09 ENCOUNTER — Telehealth (HOSPITAL_COMMUNITY): Payer: Self-pay | Admitting: *Deleted

## 2018-04-09 ENCOUNTER — Encounter: Payer: Medicaid Other | Admitting: Obstetrics & Gynecology

## 2018-04-09 NOTE — Telephone Encounter (Signed)
Preadmission screen  

## 2018-04-10 ENCOUNTER — Other Ambulatory Visit: Payer: Self-pay

## 2018-04-10 ENCOUNTER — Inpatient Hospital Stay (HOSPITAL_COMMUNITY): Payer: Medicaid Other

## 2018-04-10 ENCOUNTER — Encounter (HOSPITAL_COMMUNITY): Payer: Self-pay | Admitting: Family Medicine

## 2018-04-10 ENCOUNTER — Inpatient Hospital Stay (HOSPITAL_COMMUNITY)
Admission: AD | Admit: 2018-04-10 | Discharge: 2018-04-12 | DRG: 807 | Disposition: A | Discharge status: Home / Self Care | Payer: Medicaid Other | Attending: Obstetrics and Gynecology | Admitting: Obstetrics and Gynecology

## 2018-04-10 DIAGNOSIS — O139 Gestational [pregnancy-induced] hypertension without significant proteinuria, unspecified trimester: Secondary | ICD-10-CM | POA: Diagnosis present

## 2018-04-10 DIAGNOSIS — Z6791 Unspecified blood type, Rh negative: Secondary | ICD-10-CM | POA: Diagnosis not present

## 2018-04-10 DIAGNOSIS — O26899 Other specified pregnancy related conditions, unspecified trimester: Secondary | ICD-10-CM

## 2018-04-10 DIAGNOSIS — O134 Gestational [pregnancy-induced] hypertension without significant proteinuria, complicating childbirth: Principal | ICD-10-CM | POA: Diagnosis present

## 2018-04-10 DIAGNOSIS — Z3A38 38 weeks gestation of pregnancy: Secondary | ICD-10-CM

## 2018-04-10 DIAGNOSIS — O99824 Streptococcus B carrier state complicating childbirth: Secondary | ICD-10-CM | POA: Diagnosis present

## 2018-04-10 DIAGNOSIS — O26893 Other specified pregnancy related conditions, third trimester: Secondary | ICD-10-CM | POA: Diagnosis present

## 2018-04-10 DIAGNOSIS — O99820 Streptococcus B carrier state complicating pregnancy: Secondary | ICD-10-CM

## 2018-04-10 LAB — CBC
HCT: 39.7 % (ref 36.0–46.0)
Hemoglobin: 12.9 g/dL (ref 12.0–15.0)
MCH: 27 pg (ref 26.0–34.0)
MCHC: 32.5 g/dL (ref 30.0–36.0)
MCV: 83.2 fL (ref 80.0–100.0)
Platelets: 272 10*3/uL (ref 150–400)
RBC: 4.77 MIL/uL (ref 3.87–5.11)
RDW: 13.6 % (ref 11.5–15.5)
WBC: 8.3 10*3/uL (ref 4.0–10.5)
nRBC: 0 % (ref 0.0–0.2)

## 2018-04-10 LAB — COMPREHENSIVE METABOLIC PANEL
ALT: 45 U/L — ABNORMAL HIGH (ref 0–44)
AST: 39 U/L (ref 15–41)
Albumin: 2.6 g/dL — ABNORMAL LOW (ref 3.5–5.0)
Alkaline Phosphatase: 183 U/L — ABNORMAL HIGH (ref 38–126)
Anion gap: 13 (ref 5–15)
BUN: 8 mg/dL (ref 6–20)
CO2: 18 mmol/L — ABNORMAL LOW (ref 22–32)
Calcium: 9.3 mg/dL (ref 8.9–10.3)
Chloride: 104 mmol/L (ref 98–111)
Creatinine, Ser: 0.69 mg/dL (ref 0.44–1.00)
GFR calc Af Amer: >60 mL/min (ref >60)
GFR calc non Af Amer: >60 mL/min (ref >60)
Glucose, Bld: 83 mg/dL (ref 70–99)
Potassium: 4.6 mmol/L (ref 3.5–5.1)
Sodium: 135 mmol/L (ref 135–145)
Total Bilirubin: 0.4 mg/dL (ref 0.3–1.2)
Total Protein: 6 g/dL — ABNORMAL LOW (ref 6.5–8.1)

## 2018-04-10 LAB — PROTEIN / CREATININE RATIO, URINE
CREATININE, URINE: 70.22 mg/dL
Protein Creatinine Ratio: 0.27 mg/mg{Cre} — ABNORMAL HIGH (ref 0.00–0.15)
Total Protein, Urine: 19 mg/dL

## 2018-04-10 MED ORDER — SOD CITRATE-CITRIC ACID 500-334 MG/5ML PO SOLN: 30.0000 mL | ORAL | Status: DC | PRN

## 2018-04-10 MED ORDER — MISOPROSTOL 50MCG HALF TABLET: 50.0000 ug | ORAL_TABLET | ORAL | Status: DC | PRN

## 2018-04-10 MED ORDER — ACETAMINOPHEN 325 MG PO TABS: 650.0000 mg | ORAL_TABLET | ORAL | Status: DC | PRN

## 2018-04-10 MED ORDER — OXYTOCIN BOLUS FROM INFUSION
500.0000 mL | Freq: Once | INTRAVENOUS | Status: AC
  Administered 2018-04-10: 500 mL via INTRAVENOUS

## 2018-04-10 MED ORDER — FLEET ENEMA 7-19 GM/118ML RE ENEM: 1.0000 | ENEMA | RECTAL | Status: DC | PRN

## 2018-04-10 MED ORDER — HYDROXYZINE HCL 50 MG PO TABS: 50.0000 mg | ORAL_TABLET | Freq: Four times a day (QID) | ORAL | Status: DC | PRN

## 2018-04-10 MED ORDER — OXYCODONE-ACETAMINOPHEN 5-325 MG PO TABS: 2.0000 | ORAL_TABLET | ORAL | Status: DC | PRN

## 2018-04-10 MED ORDER — SODIUM CHLORIDE 0.9 % IV SOLN
5.0000 10*6.[IU] | Freq: Once | INTRAVENOUS | Status: AC
  Administered 2018-04-10: 5 10*6.[IU] via INTRAVENOUS
  Filled 2018-04-10: qty 5

## 2018-04-10 MED ORDER — FENTANYL CITRATE (PF) 100 MCG/2ML IJ SOLN
50.0000 ug | INTRAMUSCULAR | Status: DC | PRN
  Administered 2018-04-11 (×2): 100 ug via INTRAVENOUS
  Filled 2018-04-10 (×2): qty 2

## 2018-04-10 MED ORDER — OXYTOCIN 40 UNITS IN NORMAL SALINE INFUSION - SIMPLE MED
2.5000 [IU]/h | INTRAVENOUS | Status: DC
  Administered 2018-04-10 (×2): 2.5 [IU]/h via INTRAVENOUS
  Filled 2018-04-10: qty 1000

## 2018-04-10 MED ORDER — LACTATED RINGERS IV SOLN
INTRAVENOUS | Status: DC
  Administered 2018-04-10 – 2018-04-11 (×4): via INTRAVENOUS

## 2018-04-10 MED ORDER — MISOPROSTOL 50MCG HALF TABLET
ORAL_TABLET | ORAL | Status: AC
  Filled 2018-04-10: qty 1

## 2018-04-10 MED ORDER — LACTATED RINGERS IV SOLN
500.0000 mL | INTRAVENOUS | Status: DC | PRN
  Administered 2018-04-10: 1000 mL via INTRAVENOUS

## 2018-04-10 MED ORDER — MISOPROSTOL 50MCG HALF TABLET
50.0000 ug | ORAL_TABLET | ORAL | Status: DC
  Administered 2018-04-10 (×2): 50 ug via BUCCAL
  Filled 2018-04-10: qty 1

## 2018-04-10 MED ORDER — MISOPROSTOL 25 MCG QUARTER TABLET: 25.0000 ug | ORAL_TABLET | ORAL | Status: DC

## 2018-04-10 MED ORDER — OXYCODONE-ACETAMINOPHEN 5-325 MG PO TABS: 1.0000 | ORAL_TABLET | ORAL | Status: DC | PRN

## 2018-04-10 MED ORDER — LIDOCAINE HCL (PF) 1 % IJ SOLN
30.0000 mL | INTRAMUSCULAR | Status: AC | PRN
  Administered 2018-04-10: 30 mL via SUBCUTANEOUS
  Filled 2018-04-10: qty 30

## 2018-04-10 MED ORDER — PENICILLIN G 3 MILLION UNITS IVPB - SIMPLE MED
3.0000 10*6.[IU] | INTRAVENOUS | Status: DC
  Administered 2018-04-10 (×2): 3 10*6.[IU] via INTRAVENOUS
  Filled 2018-04-10 (×2): qty 100

## 2018-04-10 MED ORDER — ONDANSETRON HCL 4 MG/2ML IJ SOLN
4.0000 mg | Freq: Four times a day (QID) | INTRAMUSCULAR | Status: DC | PRN
  Administered 2018-04-10: 4 mg via INTRAVENOUS
  Filled 2018-04-10 (×2): qty 2

## 2018-04-10 NOTE — Anesthesia Pain Management Evaluation Note (Signed)
  CRNA Pain Management Visit Note  Patient: Leah Carroll, 18 y.o., female  "Hello I am a member of the anesthesia team at [facility] and [employer]. We have an anesthesia team available at all times to provide care throughout the hospital, including epidural management and anesthesia for C-section. I don't know your plan for the delivery whether it a natural birth, water birth, IV sedation, nitrous supplementation, doula or epidural, but we want to meet your pain goals."   1.Was your pain managed to your expectations on prior hospitalizations?   No prior hospitalizations  2.What is your expectation for pain management during this hospitalization?     Labor support without medications and IV pain meds  3.How can we help you reach that goal? *support**  Record the patient's initial score and the patient's pain goal.   Pain: 2  Pain Goal: 8 [facility] wants you to be able to say your pain was always managed very well.  Trellis Paganini 04/10/2018

## 2018-04-10 NOTE — Progress Notes (Signed)
Labor Progress Note  Subjective: Called to pt room by RN for possible SROM and cervical exam. Pt resting comfortably with family at bedside  Objective: BP 135/81 (BP Location: Right Arm)   Pulse 81   Temp 97.9 F (36.6 C) (Oral)   Resp 18   Ht 4\' 11"  (1.499 m)   Wt 90.3 kg   LMP 06/29/2017 (Approximate)   BMI 40.19 kg/m  Gen: well appearing, no distress  Dilation: 4 Effacement (%): 50 Station: -2 Presentation: Vertex Exam by:: Camelia Eng, Student midwife  Assessment and Plan: 18 y.o. G1P0000 [redacted]w[redacted]d in for IOL for GHTN. Foley bulb out. Cervix examined and 4/50/-2, vtx. Pt intact, with no obvious fluid on perineum or cervical exam.  Labor: Buccal cytotec -- pain control: none -- PPH Risk: high  Fetal Well-Being:  -- Cephalic by SVE.  -- Category 1, FHR 135, mod variability, +accels, no decels -- GBS positive  Camelia Eng, SNM 2:44 PM
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
## 2018-04-11 ENCOUNTER — Encounter (HOSPITAL_COMMUNITY): Payer: Self-pay

## 2018-04-11 DIAGNOSIS — Z3A38 38 weeks gestation of pregnancy: Secondary | ICD-10-CM

## 2018-04-11 DIAGNOSIS — O99824 Streptococcus B carrier state complicating childbirth: Secondary | ICD-10-CM

## 2018-04-11 DIAGNOSIS — O134 Gestational [pregnancy-induced] hypertension without significant proteinuria, complicating childbirth: Secondary | ICD-10-CM

## 2018-04-11 LAB — COMPREHENSIVE METABOLIC PANEL
ALT: 33 U/L (ref 0–44)
AST: 40 U/L (ref 15–41)
Albumin: 1.9 g/dL — ABNORMAL LOW (ref 3.5–5.0)
Alkaline Phosphatase: 128 U/L — ABNORMAL HIGH (ref 38–126)
Anion gap: 10 (ref 5–15)
BUN: 5 mg/dL — ABNORMAL LOW (ref 6–20)
CO2: 22 mmol/L (ref 22–32)
Calcium: 8.6 mg/dL — ABNORMAL LOW (ref 8.9–10.3)
Chloride: 103 mmol/L (ref 98–111)
Creatinine, Ser: 0.86 mg/dL (ref 0.44–1.00)
GFR calc Af Amer: >60 mL/min (ref >60)
Glucose, Bld: 116 mg/dL — ABNORMAL HIGH (ref 70–99)
Potassium: 4.3 mmol/L (ref 3.5–5.1)
Sodium: 135 mmol/L (ref 135–145)
TOTAL PROTEIN: 5.1 g/dL — AB (ref 6.5–8.1)
Total Bilirubin: 0.6 mg/dL (ref 0.3–1.2)

## 2018-04-11 LAB — CBC
HCT: 34.9 % — ABNORMAL LOW (ref 36.0–46.0)
HEMATOCRIT: 29.7 % — AB (ref 36.0–46.0)
Hemoglobin: 11.2 g/dL — ABNORMAL LOW (ref 12.0–15.0)
Hemoglobin: 9.3 g/dL — ABNORMAL LOW (ref 12.0–15.0)
MCH: 26.7 pg (ref 26.0–34.0)
MCH: 26 pg (ref 26.0–34.0)
MCHC: 32.1 g/dL (ref 30.0–36.0)
MCHC: 31.3 g/dL (ref 30.0–36.0)
MCV: 83.3 fL (ref 80.0–100.0)
MCV: 83 fL (ref 80.0–100.0)
Platelets: 266 10*3/uL (ref 150–400)
Platelets: 225 10*3/uL (ref 150–400)
RBC: 4.19 MIL/uL (ref 3.87–5.11)
RBC: 3.58 MIL/uL — ABNORMAL LOW (ref 3.87–5.11)
RDW: 13.7 % (ref 11.5–15.5)
RDW: 13.8 % (ref 11.5–15.5)
WBC: 22.3 10*3/uL — ABNORMAL HIGH (ref 4.0–10.5)
WBC: 18.3 10*3/uL — ABNORMAL HIGH (ref 4.0–10.5)
nRBC: 0 % (ref 0.0–0.2)
nRBC: 0 % (ref 0.0–0.2)

## 2018-04-11 LAB — APTT: aPTT: 28 seconds (ref 24–36)

## 2018-04-11 LAB — PROTIME-INR
INR: 1 (ref 0.8–1.2)
Prothrombin Time: 13.2 seconds (ref 11.4–15.2)

## 2018-04-11 LAB — RPR: RPR Ser Ql: NONREACTIVE

## 2018-04-11 LAB — FIBRINOGEN: Fibrinogen: 498 mg/dL — ABNORMAL HIGH (ref 210–475)

## 2018-04-11 MED ORDER — TRANEXAMIC ACID-NACL 1000-0.7 MG/100ML-% IV SOLN
1000.0000 mg | INTRAVENOUS | Status: AC
  Administered 2018-04-11: 1000 mg via INTRAVENOUS

## 2018-04-11 MED ORDER — MEASLES, MUMPS & RUBELLA VAC IJ SOLR: 0.5000 mL | Freq: Once | INTRAMUSCULAR | Status: DC

## 2018-04-11 MED ORDER — DIBUCAINE 1 % RE OINT: 1.0000 "application " | TOPICAL_OINTMENT | RECTAL | Status: DC | PRN

## 2018-04-11 MED ORDER — TETANUS-DIPHTH-ACELL PERTUSSIS 5-2.5-18.5 LF-MCG/0.5 IM SUSP: 0.5000 mL | Freq: Once | INTRAMUSCULAR | Status: DC

## 2018-04-11 MED ORDER — ACETAMINOPHEN 325 MG PO TABS: 650.0000 mg | ORAL_TABLET | ORAL | Status: DC | PRN

## 2018-04-11 MED ORDER — SENNOSIDES-DOCUSATE SODIUM 8.6-50 MG PO TABS
2.0000 | ORAL_TABLET | ORAL | Status: DC
  Administered 2018-04-11 – 2018-04-12 (×2): 2 via ORAL
  Filled 2018-04-11 (×3): qty 2

## 2018-04-11 MED ORDER — DIPHENHYDRAMINE HCL 25 MG PO CAPS: 25.0000 mg | ORAL_CAPSULE | Freq: Four times a day (QID) | ORAL | Status: DC | PRN

## 2018-04-11 MED ORDER — WITCH HAZEL-GLYCERIN EX PADS: 1.0000 "application " | MEDICATED_PAD | CUTANEOUS | Status: DC | PRN

## 2018-04-11 MED ORDER — MISOPROSTOL 200 MCG PO TABS
ORAL_TABLET | ORAL | Status: AC
  Filled 2018-04-11: qty 4

## 2018-04-11 MED ORDER — ONDANSETRON HCL 4 MG PO TABS: 4.0000 mg | ORAL_TABLET | ORAL | Status: DC | PRN

## 2018-04-11 MED ORDER — TRANEXAMIC ACID-NACL 1000-0.7 MG/100ML-% IV SOLN
INTRAVENOUS | Status: AC
  Administered 2018-04-11: 1000 mg via INTRAVENOUS
  Filled 2018-04-11: qty 100

## 2018-04-11 MED ORDER — RHO D IMMUNE GLOBULIN 1500 UNIT/2ML IJ SOSY
300.0000 ug | PREFILLED_SYRINGE | Freq: Once | INTRAMUSCULAR | Status: AC
  Administered 2018-04-11: 300 ug via INTRAVENOUS
  Filled 2018-04-11: qty 2

## 2018-04-11 MED ORDER — LACTATED RINGERS IV SOLN: INTRAVENOUS | Status: DC

## 2018-04-11 MED ORDER — METHYLERGONOVINE MALEATE 0.2 MG/ML IJ SOLN
INTRAMUSCULAR | Status: AC
  Filled 2018-04-11: qty 1

## 2018-04-11 MED ORDER — ZOLPIDEM TARTRATE 5 MG PO TABS: 5.0000 mg | ORAL_TABLET | Freq: Every evening | ORAL | Status: DC | PRN

## 2018-04-11 MED ORDER — CEFAZOLIN SODIUM-DEXTROSE 1-4 GM/50ML-% IV SOLN
1.0000 g | Freq: Once | INTRAVENOUS | Status: AC
  Administered 2018-04-11: 1 g via INTRAVENOUS
  Filled 2018-04-11: qty 50

## 2018-04-11 MED ORDER — SIMETHICONE 80 MG PO CHEW: 80.0000 mg | CHEWABLE_TABLET | ORAL | Status: DC | PRN

## 2018-04-11 MED ORDER — TRANEXAMIC ACID-NACL 1000-0.7 MG/100ML-% IV SOLN
1000.0000 mg | Freq: Once | INTRAVENOUS | Status: AC | PRN
  Administered 2018-04-11: 1000 mg via INTRAVENOUS
  Filled 2018-04-11: qty 100

## 2018-04-11 MED ORDER — PRENATAL MULTIVITAMIN CH
1.0000 | ORAL_TABLET | Freq: Every day | ORAL | Status: DC
  Administered 2018-04-11 – 2018-04-12 (×2): 1 via ORAL
  Filled 2018-04-11 (×2): qty 1

## 2018-04-11 MED ORDER — METHYLERGONOVINE MALEATE 0.2 MG/ML IJ SOLN
0.2000 mg | Freq: Once | INTRAMUSCULAR | Status: AC
  Administered 2018-04-11: 0.2 mg via INTRAMUSCULAR

## 2018-04-11 MED ORDER — COCONUT OIL OIL: 1.0000 "application " | TOPICAL_OIL | Status: DC | PRN

## 2018-04-11 MED ORDER — ONDANSETRON HCL 4 MG/2ML IJ SOLN: 4.0000 mg | INTRAMUSCULAR | Status: DC | PRN

## 2018-04-11 MED ORDER — MISOPROSTOL 200 MCG PO TABS
800.0000 ug | ORAL_TABLET | Freq: Once | ORAL | Status: AC
  Administered 2018-04-11: 800 ug via RECTAL

## 2018-04-11 MED ORDER — IBUPROFEN 600 MG PO TABS
600.0000 mg | ORAL_TABLET | Freq: Four times a day (QID) | ORAL | Status: DC
  Administered 2018-04-11 – 2018-04-12 (×5): 600 mg via ORAL
  Filled 2018-04-11 (×6): qty 1

## 2018-04-11 MED ORDER — SODIUM CHLORIDE 0.9 % IV SOLN
510.0000 mg | Freq: Once | INTRAVENOUS | Status: AC
  Administered 2018-04-11: 510 mg via INTRAVENOUS
  Filled 2018-04-11: qty 17

## 2018-04-11 MED ORDER — BENZOCAINE-MENTHOL 20-0.5 % EX AERO
1.0000 "application " | INHALATION_SPRAY | CUTANEOUS | Status: DC | PRN
  Administered 2018-04-11: 1 via TOPICAL
  Filled 2018-04-11: qty 56

## 2018-04-11 NOTE — Lactation Note (Signed)
This note was copied from a baby's chart. Lactation Consultation Note  Patient Name: Leah Carroll Today's Date: 04/11/2018 Reason for consult: Initial assessment;Early term 37-38.6wks P1, 7 hour female infant. LC entered the room , mom and infant asleep.   Maternal Data    Feeding Feeding Type: Breast Fed  LATCH Score Latch: Repeated attempts needed to sustain latch, nipple held in mouth throughout feeding, stimulation needed to elicit sucking reflex.  Audible Swallowing: A few with stimulation  Type of Nipple: Everted at rest and after stimulation  Comfort (Breast/Nipple): Soft / non-tender  Hold (Positioning): Assistance needed to correctly position infant at breast and maintain latch.  LATCH Score: 7  Interventions    Lactation Tools Discussed/Used     Consult Status      Danelle Earthly 04/11/2018, 6:02 AM

## 2018-04-11 NOTE — Discharge Summary (Signed)
Obstetrics Discharge Summary OB/GYN Faculty Practice   Patient Name: Leah Carroll DOB: 2000-05-04 MRN: [id number]  Date of admission: 04/10/2018 Delivering MD: Tamera Stands   Date of discharge: 04/12/2018  Admitting diagnosis: preadmission Intrauterine pregnancy: [redacted]w[redacted]d     Secondary diagnosis:   Principal Problem:   Gestational hypertension Active Problems:   Rh negative state in antepartum period   GBS (group B Streptococcus carrier), +RV culture, currently pregnant   Postpartum hemorrhage  Discharge diagnosis: Term Pregnancy Delivered                                            Postpartum procedures: RhoGAM administered  IV feraheme for postpartum hemorrhage Complications: PPH - HgB from 12.9 > 7.9 on PPD#2 (asymptomatic)  Outpatient Follow-Up: [ ]  consider repeat CBC given PPH [ ]  continue to counsel on contraception   Hospital course: Leah Carroll is a 18 y.o. [redacted]w[redacted]d who was admitted for induction for gestational hypertension. Her pregnancy was complicated by gestational hypertension. Her labor course was notable for induction with FB and cytotec. Delivery was complicated by postpartum hemorrhage with EBL of about 1300cc. Her HgB trended down to 7.9 on PPD#2, but she was asymptomatic and discharged home with oral iron. Please see delivery/op note for additional details. Her postpartum course was uncomplicated. Her blood pressure was well-controlled. She was breastfeeding without difficulty. By day of discharge, she was passing flatus, urinating, eating and drinking without difficulty. Her pain was well-controlled, and she was discharged home with ibuprofen. She will follow-up in clinic in 4-6 weeks for routine postpartum visit and in 1 week for BP check.   Physical exam  Vitals:   04/11/18 1145 04/11/18 1400 04/11/18 2200 04/12/18 0507  BP: 126/73 118/70 132/79 124/70  Pulse: (!) 102 99 97 82  Resp: 20 18 18 18   Temp: 98.8 F (37.1 C) 98 F (36.7 C) 97.6 F (36.4  C) 98.2 F (36.8 C)  TempSrc:   Oral Oral  SpO2: 97%  98% 99%  Weight:      Height:       General: well-appearing, NAD Lochia: appropriate Uterine Fundus: firm Incision: N/A DVT Evaluation: No significant calf/ankle edema  Labs: Lab Results  Component Value Date   WBC 11.3 (H) 04/12/2018   HGB 7.9 (L) 04/12/2018   HCT 24.7 (L) 04/12/2018   MCV 85.8 04/12/2018   PLT 216 04/12/2018   CMP Latest Ref Rng & Units 04/11/2018  Glucose 70 - 99 mg/dL 144(R)  BUN 6 - 20 mg/dL 5(L)  Creatinine 1.54 - 1.00 mg/dL 0.08  Sodium 676 - 195 mmol/L 135  Potassium 3.5 - 5.1 mmol/L 4.3  Chloride 98 - 111 mmol/L 103  CO2 22 - 32 mmol/L 22  Calcium 8.9 - 10.3 mg/dL 0.9(T)  Total Protein 6.5 - 8.1 g/dL 5.1(L)  Total Bilirubin 0.3 - 1.2 mg/dL 0.6  Alkaline Phos 38 - 126 U/L 128(H)  AST 15 - 41 U/L 40  ALT 0 - 44 U/L 33    Discharge instructions: Per After Visit Summary and "Baby and Me Booklet"  After visit meds:  Allergies as of 04/12/2018      Reactions   Banana Rash   Cinnamon Rash   Dust Mite Extract Rash   Eggs Or Egg-derived Products Rash      Medication List    TAKE these medications   ferrous  sulfate 325 (65 FE) MG tablet Take 1 tablet (325 mg total) by mouth every other day. Take with Vitamin C.   ibuprofen 800 MG tablet Commonly known as:  ADVIL,MOTRIN Take 1 tablet (800 mg total) by mouth 3 (three) times daily.   PRENATAL GUMMIES/DHA & FA 0.4-32.5 MG Chew Chew 2 tablets by mouth daily.   senna-docusate 8.6-50 MG tablet Commonly known as:  Senokot-S Take 2 tablets by mouth at bedtime as needed for mild constipation.   triamcinolone cream 0.1 % Commonly known as:  KENALOG Apply 1 application topically daily.      Postpartum contraception: Undecided Diet: Routine Diet Activity: Advance as tolerated. Pelvic rest for 6 weeks.   Follow-Up: Please schedule this patient for Postpartum visit in: 4 weeks with the following provider: Any provider High risk pregnancy  complicated by: gHTN Delivery mode:  SVD Anticipated Birth Control:  other/unsure PP Procedures needed: BP check  Schedule Integrated BH visit: no  Newborn Data: Live born female  Birth Weight: 7 lb 8.1 oz (3405 g) APGAR: 7, 9  Newborn Delivery   Birth date/time:  04/10/2018 22:53:00 Delivery type:  Vaginal, Spontaneous    Baby Feeding: Both Disposition:home with mother  Cristal Deer. Earlene Plater, DO OB/GYN Fellow, Faculty Practice
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
## 2018-04-11 NOTE — Progress Notes (Signed)
Post Partum Day 1 s.p vag del with postpartum Hemorrhage. Now stable with no symptoms of bleeding or light headedness Subjective: no complaints, up ad lib, voiding and tolerating PO  Objective: Blood pressure 118/78, pulse 79, temperature 98.8 F (37.1 C), temperature source Oral, resp. rate 18, height 4\' 11"  (1.499 m), weight 90.3 kg, last menstrual period 06/29/2017, SpO2 98 %, unknown if currently breastfeeding.  Physical Exam:  General: alert, cooperative and no distress Lochia: appropriate Uterine Fundus: firm deep in pelvis, nontender Incision:  DVT Evaluation: No evidence of DVT seen on physical exam.  Recent Labs    04/11/18 0128 04/11/18 0547  HGB 11.2* 9.3*  HCT 34.9* 29.7*   CBC Latest Ref Rng & Units 04/11/2018 04/11/2018 04/10/2018  WBC 4.0 - 10.5 K/uL 18.3(H) 22.3(H) 8.3  Hemoglobin 12.0 - 15.0 g/dL 7.9(Y) 11.2(L) 12.9  Hematocrit 36.0 - 46.0 % 29.7(L) 34.9(L) 39.7  Platelets 150 - 400 K/uL 225 266 272   HGB DROP was 3.6 gm  Assessment/Plan: Plan for discharge tomorrow, Breastfeeding and Contraception undecided, has used nexplanon in past, will likely resume Nexplanon   LOS: 1 day   Tilda Burrow 04/11/2018, 10:04 AM

## 2018-04-12 LAB — RH IG WORKUP (INCLUDES ABO/RH)
ABO/RH(D): O NEG
Fetal Screen: NEGATIVE
Gestational Age(Wks): 38.5
Unit division: 0

## 2018-04-12 LAB — CBC
HCT: 24.7 % — ABNORMAL LOW (ref 36.0–46.0)
Hemoglobin: 7.9 g/dL — ABNORMAL LOW (ref 12.0–15.0)
MCH: 27.4 pg (ref 26.0–34.0)
MCHC: 32 g/dL (ref 30.0–36.0)
MCV: 85.8 fL (ref 80.0–100.0)
Platelets: 216 10*3/uL (ref 150–400)
RBC: 2.88 MIL/uL — ABNORMAL LOW (ref 3.87–5.11)
RDW: 14.5 % (ref 11.5–15.5)
WBC: 11.3 10*3/uL — ABNORMAL HIGH (ref 4.0–10.5)
nRBC: 0 % (ref 0.0–0.2)

## 2018-04-12 MED ORDER — SENNOSIDES-DOCUSATE SODIUM 8.6-50 MG PO TABS: 2.0000 | ORAL_TABLET | Freq: Every evening | ORAL | 0 refills | Status: DC | PRN

## 2018-04-12 MED ORDER — IBUPROFEN 800 MG PO TABS: 800.0000 mg | ORAL_TABLET | Freq: Three times a day (TID) | ORAL | 0 refills | Status: DC

## 2018-04-12 MED ORDER — FERROUS SULFATE 325 (65 FE) MG PO TABS: 325.0000 mg | ORAL_TABLET | ORAL | 0 refills | Status: DC

## 2018-04-12 NOTE — Lactation Note (Addendum)
This note was copied from a baby's chart. Lactation Consultation Note  Patient Name: Boy Kween Bacorn Today's Date: 04/12/2018   "Darlyn Chamber" is 58 hours old, Mom is a P1 who had a PPH (EBL 1300 mLs). Infant was DAT+, but bili levels have been WNL. Mom says that Darlyn Chamber is breastfeeding well & that she can hear swallows. A smear was noted by nurse this morning. Mom reports that she was told that infant stooled at birth.  Mom plans on getting a pump through [facility] & Mom wants to start pumping sooner than later (so that Dad can give bottles). Mom was provided a DEBP kit & parents were shown how to assemble & use hand pump (single- and double-mode) that was included in pump kit. Cleaning & sanitizing of pump parts discussed. Mom was able to return demonstration of reassembling pump parts.   I informed Mom that hand expression can be more effective in the 1st few days. Mom was taught hand expression with good result. She was able to return demonstration and the colostrum flowed with ease. The resulting colostrum was given to infant on a gloved finger. Mom does not yet feel an increase in heaviness in her breasts.   Parents were given our phone # for any post-discharge questions.    Matthias Hughs [facility] 04/12/2018, 9:21 AM

## 2018-04-13 LAB — TYPE AND SCREEN
ABO/RH(D): O NEG
ANTIBODY SCREEN: POSITIVE
Unit division: 0
Unit division: 0

## 2018-04-13 LAB — BPAM RBC
Blood Product Expiration Date: 202003082359
Blood Product Expiration Date: 202003202359
Unit Type and Rh: 9500
Unit Type and Rh: 9500

## 2018-04-20 ENCOUNTER — Telehealth: Payer: Self-pay | Admitting: *Deleted

## 2018-04-20 MED ORDER — PRENATAL PLUS 27-1 MG PO TABS: 1.0000 | ORAL_TABLET | Freq: Every day | ORAL | 12 refills | Status: DC

## 2018-04-20 NOTE — Telephone Encounter (Signed)
Hemoglobin 9.1 called from [facility] office.  Patient is not taking a PNV as she states she needs a prescription sent in.

## 2018-04-20 NOTE — Addendum Note (Signed)
Addended by: Cheral Marker on: 04/20/2018 12:45 PM   Modules accepted: Orders

## 2018-05-13 ENCOUNTER — Encounter: Payer: Self-pay | Admitting: *Deleted

## 2018-05-14 ENCOUNTER — Ambulatory Visit: Payer: Medicaid Other | Admitting: Women's Health

## 2018-07-19 ENCOUNTER — Other Ambulatory Visit: Payer: Self-pay | Admitting: Family Medicine

## 2018-07-19 DIAGNOSIS — L209 Atopic dermatitis, unspecified: Secondary | ICD-10-CM

## 2018-08-25 ENCOUNTER — Other Ambulatory Visit: Payer: Self-pay

## 2018-08-25 ENCOUNTER — Ambulatory Visit (INDEPENDENT_AMBULATORY_CARE_PROVIDER_SITE_OTHER): Payer: Medicaid Other | Admitting: Family Medicine

## 2018-08-25 DIAGNOSIS — R05 Cough: Secondary | ICD-10-CM

## 2018-08-25 DIAGNOSIS — R059 Cough, unspecified: Secondary | ICD-10-CM

## 2018-08-25 MED ORDER — BENZONATATE 200 MG PO CAPS: 200.0000 mg | ORAL_CAPSULE | Freq: Three times a day (TID) | ORAL | 0 refills | Status: DC | PRN

## 2018-08-25 MED ORDER — CETIRIZINE HCL 10 MG PO TABS: 10.0000 mg | ORAL_TABLET | Freq: Every day | ORAL | 11 refills | Status: DC
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
## 2018-08-25 NOTE — Patient Instructions (Signed)

## 2018-09-18 IMAGING — US US OB COMP LESS 14 WK
1 series · 14 of 28 positions shown · non-contrast
Comparison: None.

CLINICAL DATA: Dating

EXAM:
OBSTETRIC <14 WK ULTRASOUND
TECHNIQUE: Transabdominal ultrasound was performed for evaluation of the
gestation as well as the maternal uterus and adnexal regions.

[Series 1: us ob comp less 14 wk · 0.22mm/px · 44 acquisitions, 14 frames shown]
[im 2/44]
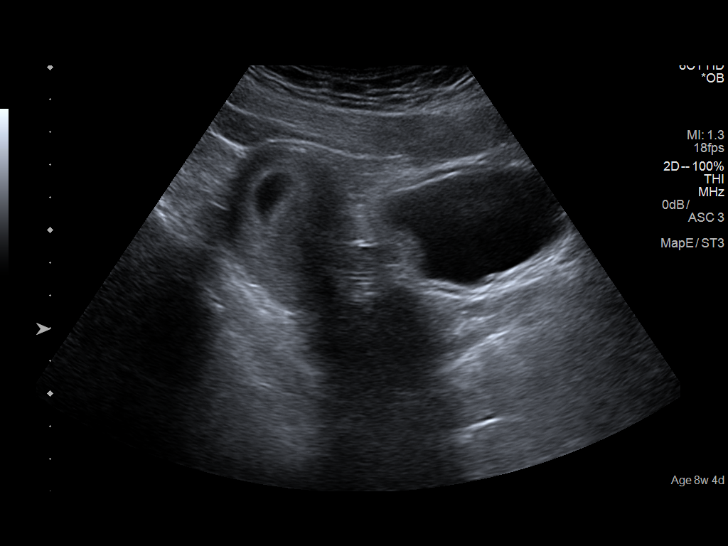
[im 5/44]
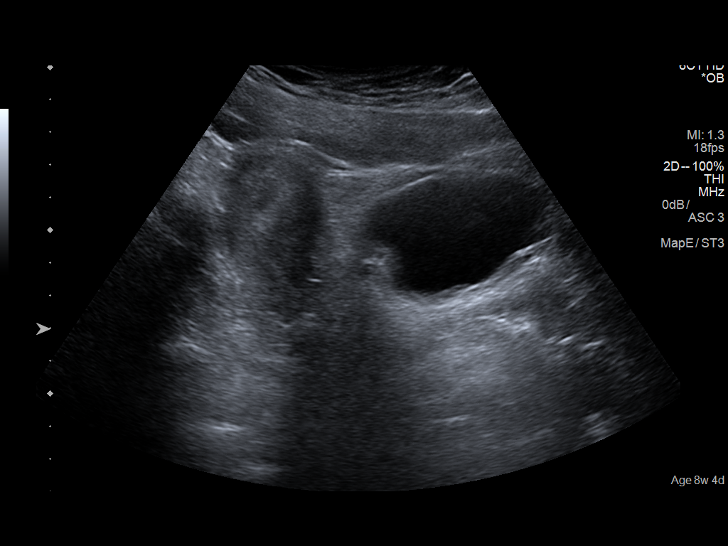
[im 8/44]
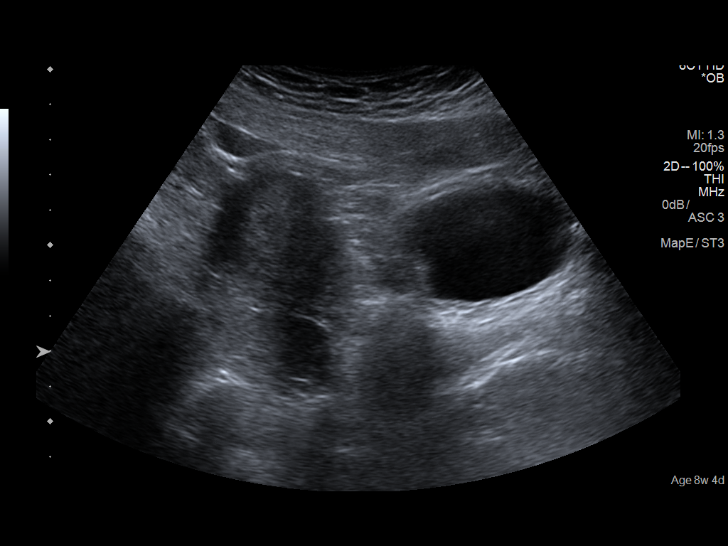
[im 12/44]
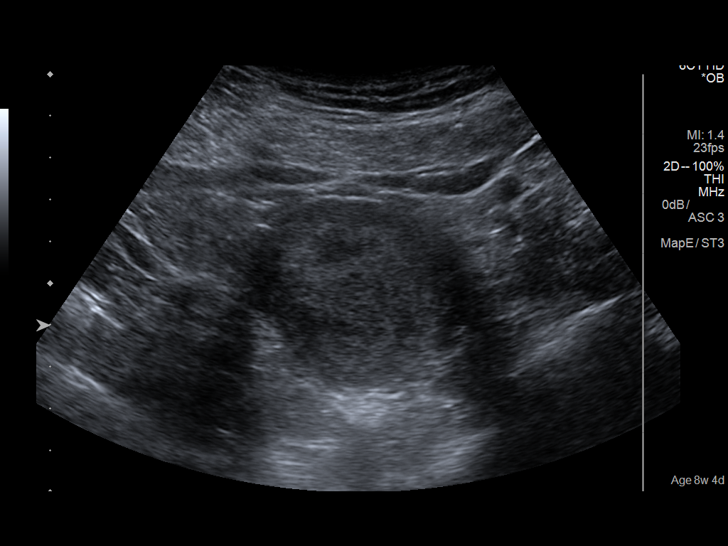
[im 15/44]
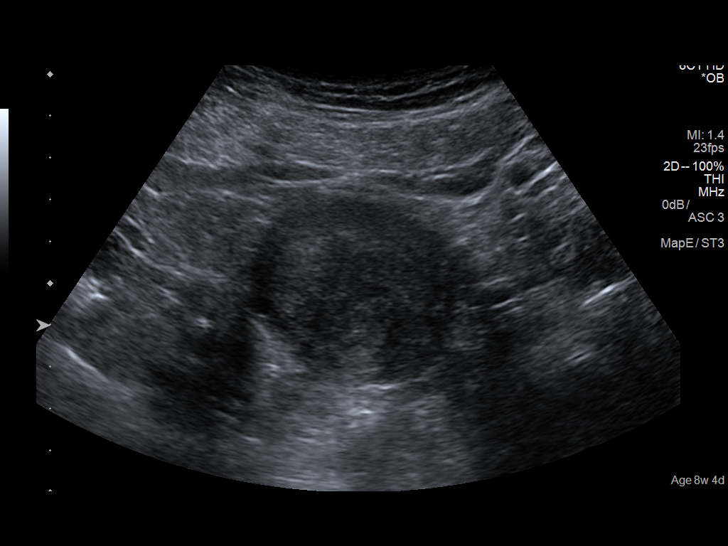
[im 18/44]
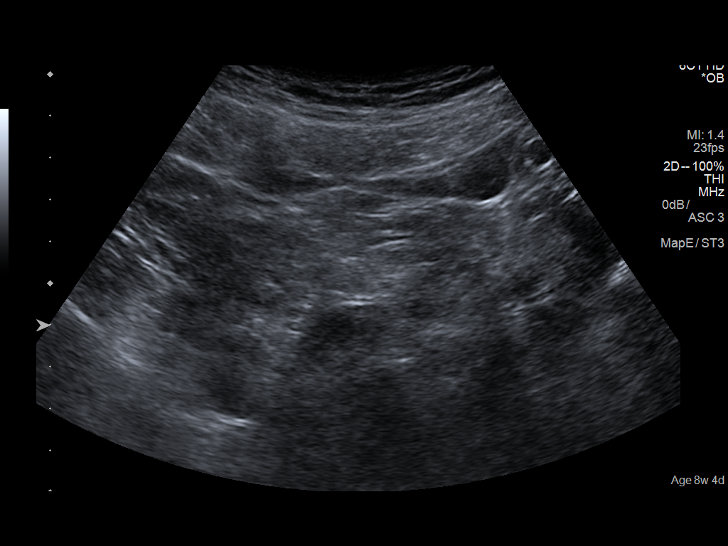
[im 21/44]
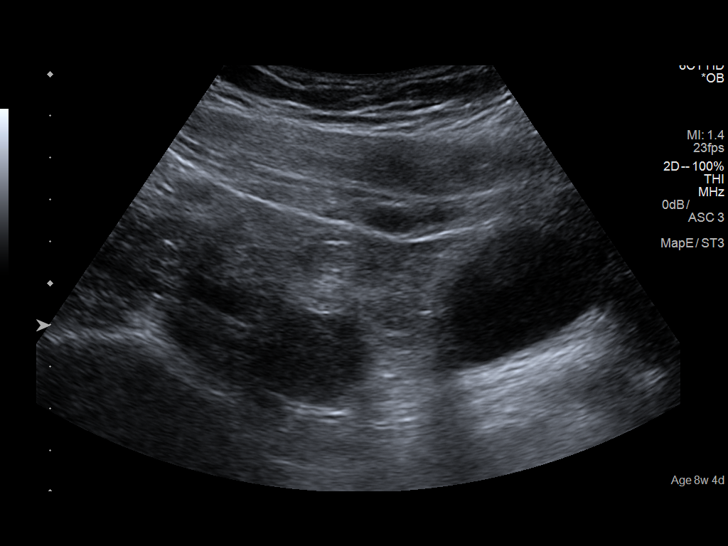
[im 24/44]
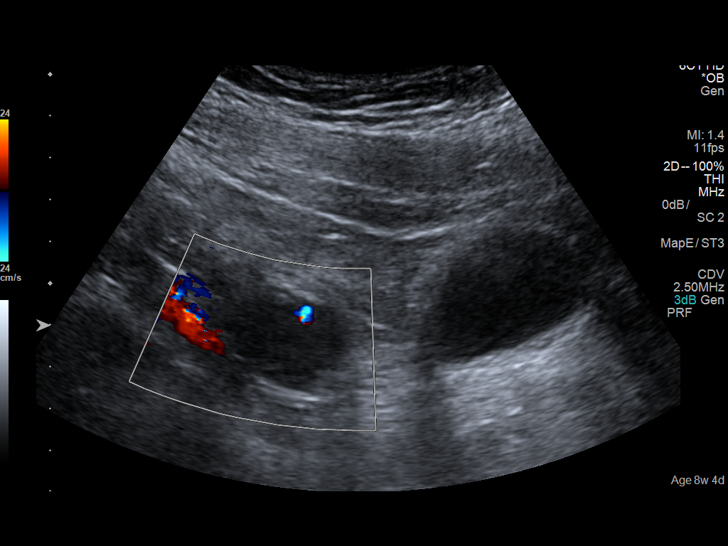
[im 28/44]
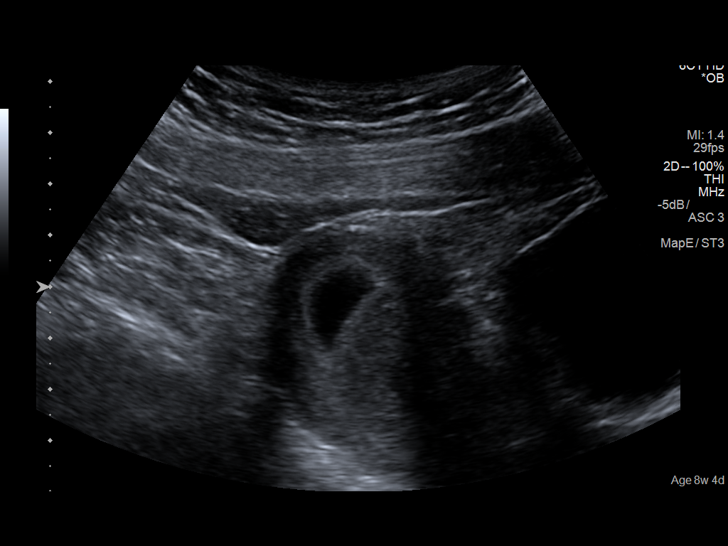
[im 31/44]
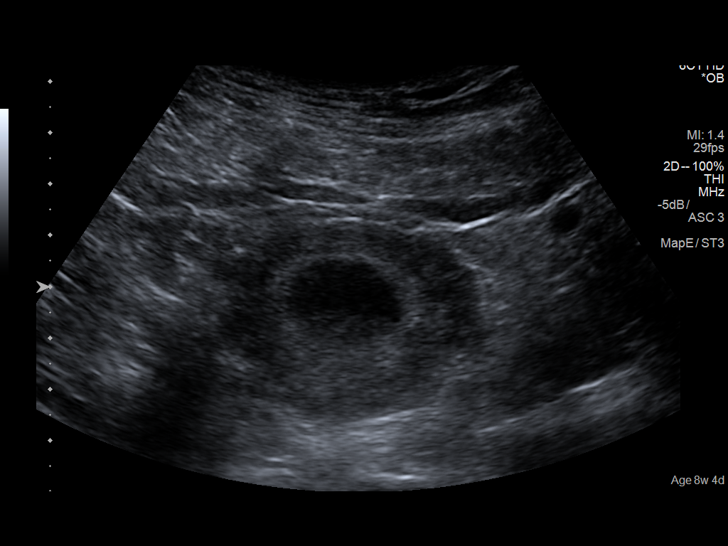
[im 34/44]
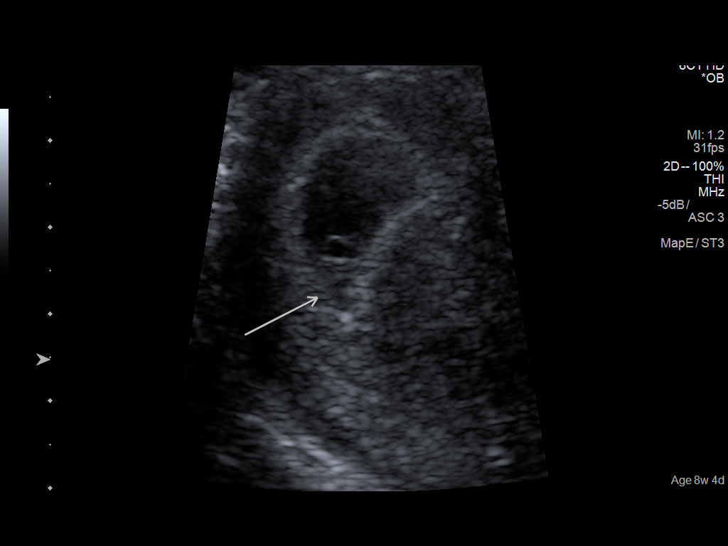
[im 37/44]
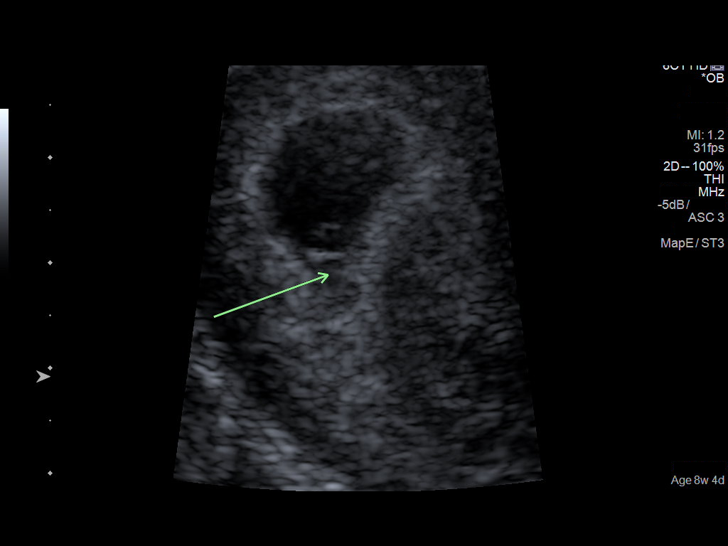
[im 40/44]
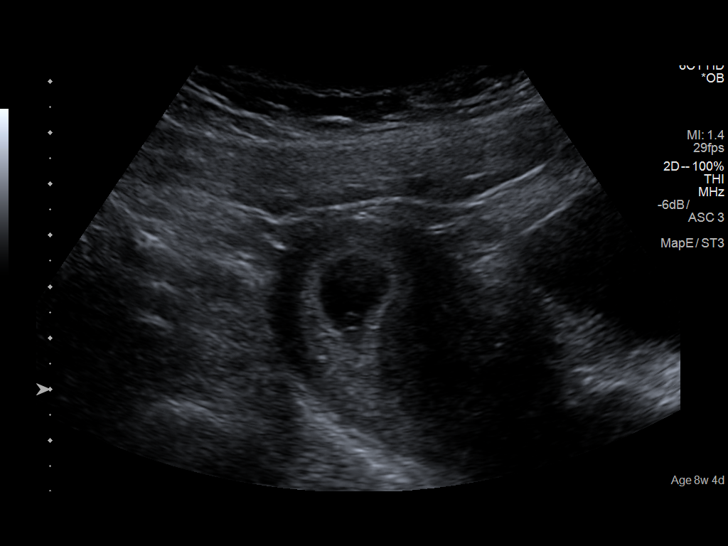
[im 44/44]
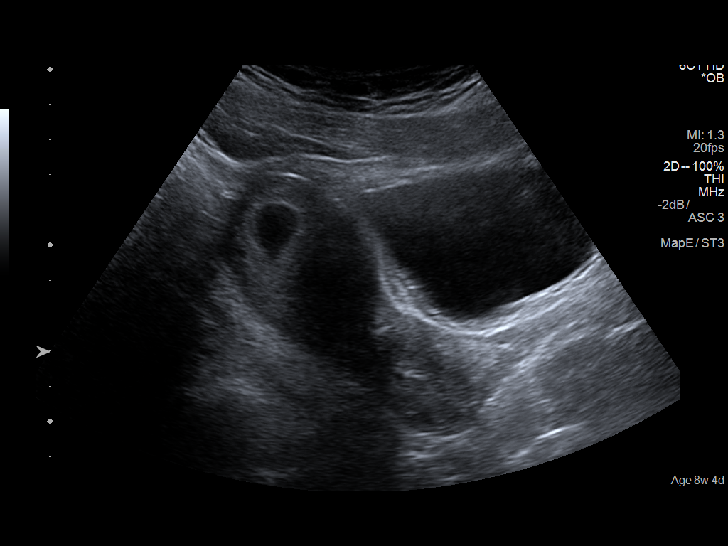

[14 of 28 positions shown; findings below may reference images not displayed]

FINDINGS: Intrauterine gestational sac: Single

Yolk sac:  Visualized

Embryo:  Visualized

Cardiac Activity: Visualized

Heart Rate: 178 bpm

MSD:   mm    w     d

CRL:   6.1 mm   6 w 3 d                  US EDC: 04/20/2018

Subchorionic hemorrhage:  No subchorionic hemorrhage

Maternal uterus/adnexae: No adnexal mass or free fluid.
IMPRESSION: Six week 3 day intrauterine pregnancy. Fetal heart rate 178 beats
per minute. No acute maternal findings.

## 2018-10-16 DIAGNOSIS — Z20828 Contact with and (suspected) exposure to other viral communicable diseases: Secondary | ICD-10-CM | POA: Diagnosis not present

## 2018-10-16 DIAGNOSIS — R062 Wheezing: Secondary | ICD-10-CM | POA: Diagnosis not present

## 2018-10-16 DIAGNOSIS — J329 Chronic sinusitis, unspecified: Secondary | ICD-10-CM | POA: Diagnosis not present

## 2018-10-19 ENCOUNTER — Ambulatory Visit: Payer: Self-pay | Admitting: *Deleted

## 2018-10-19 ENCOUNTER — Telehealth: Payer: Self-pay | Admitting: Family Medicine

## 2018-10-19 NOTE — Telephone Encounter (Signed)
Patient has televisit with Dr. Darnell Level tomorrow.

## 2018-10-19 NOTE — Telephone Encounter (Signed)
Pt called with having wheezing and some shortness of breath on Saturday. She stated that she has a hx of asthma but does not remember having an asthma attack. She is wheezing and has a cough. She also stated that she has not been around anyone except her mom who gets tested weekly for covid-19. She has a 73 month old at home. She denies fever and no other symptoms. She went to an urgent care on Saturday and was prescribed an antibiotic and cough medications per pt. She notified her provider and has a Risk analyst for tomorrow.  She is requesting a chest xray. Advised that she get tested for covid-19.  She voiced understanding and she is going to [facility].  Reason for Disposition . [1] MILD difficulty breathing (e.g., minimal/no SOB at rest, SOB with walking, pulse <100) AND [2] NEW-onset or WORSE than normal  Answer Assessment - Initial Assessment Questions 1. RESPIRATORY STATUS: "Describe your breathing?" (e.g., wheezing, shortness of breath, unable to speak, severe coughing)      wheezing 2. ONSET: "When did this breathing problem begin?"      Friday 3. PATTERN "Does the difficult breathing come and go, or has it been constant since it started?"      Comes and goes 4. SEVERITY: "How bad is your breathing?" (e.g., mild, moderate, severe)    - MILD: No SOB at rest, mild SOB with walking, speaks normally in sentences, can lay down, no retractions, pulse < 100.    - MODERATE: SOB at rest, SOB with minimal exertion and prefers to sit, cannot lie down flat, speaks in phrases, mild retractions, audible wheezing, pulse 100-120.    - SEVERE: Very SOB at rest, speaks in single words, struggling to breathe, sitting hunched forward, retractions, pulse > 120      mild 5. RECURRENT SYMPTOM: "Have you had difficulty breathing before?" If so, ask: "When was the last time?" and "What happened that time?"      no 6. CARDIAC HISTORY: "Do you have any history of heart disease?" (e.g., heart  attack, angina, bypass surgery, angioplasty)      no 7. LUNG HISTORY: "Do you have any history of lung disease?"  (e.g., pulmonary embolus, asthma, emphysema)     no 8. CAUSE: "What do you think is causing the breathing problem?"      Not sure what is causing this 9. OTHER SYMPTOMS: "Do you have any other symptoms? (e.g., dizziness, runny nose, cough, chest pain, fever)     Cough, stuffy nose, 10. PREGNANCY: "Is there any chance you are pregnant?" "When was your last menstrual period?"       Not pregnant, LMP 08/22 11. TRAVEL: "Have you traveled out of the country in the last month?" (e.g., travel history, exposures)       No  Protocols used: BREATHING DIFFICULTY-A-AH

## 2018-10-20 ENCOUNTER — Ambulatory Visit (INDEPENDENT_AMBULATORY_CARE_PROVIDER_SITE_OTHER): Payer: Medicaid Other | Admitting: Family Medicine

## 2018-10-20 ENCOUNTER — Encounter: Payer: Self-pay | Admitting: Family Medicine

## 2018-10-20 ENCOUNTER — Ambulatory Visit (HOSPITAL_COMMUNITY)
Admission: RE | Admit: 2018-10-20 | Discharge: 2018-10-20 | Disposition: A | Discharge status: Home / Self Care | Payer: Medicaid Other | Source: Ambulatory Visit | Attending: Family Medicine | Admitting: Family Medicine

## 2018-10-20 ENCOUNTER — Other Ambulatory Visit: Payer: Self-pay

## 2018-10-20 DIAGNOSIS — R0602 Shortness of breath: Secondary | ICD-10-CM | POA: Diagnosis not present

## 2018-10-20 DIAGNOSIS — J4521 Mild intermittent asthma with (acute) exacerbation: Secondary | ICD-10-CM

## 2018-10-20 MED ORDER — METHYLPREDNISOLONE 4 MG PO TBPK: ORAL_TABLET | ORAL | 0 refills | Status: DC
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
## 2018-10-20 NOTE — Patient Instructions (Signed)
Prevent the Spread of COVID-19 if You Are Sick If you are sick with COVID-19 or think you might have COVID-19, follow the steps below to help protect other people in your home and community. Stay home except to get medical care.  Stay home. Most people with COVID-19 have mild illness and are able to recover at home without medical care. Do not leave your home, except to get medical care. Do not visit public areas.  Take care of yourself. Get rest and stay hydrated.  Get medical care when needed. Call your doctor before you go to their office for care. But, if you have trouble breathing or other concerning symptoms, call 911 for immediate help.  Avoid public transportation, ride-sharing, or taxis. Separate yourself from other people and pets in your home.  As much as possible, stay in a specific room and away from other people and pets in your home. Also, you should use a separate bathroom, if available. If you need to be around other people or animals in or outside of the home, wear a cloth face covering. ? See COVID-19 and Animals if you have questions about pets: https://www.cdc.gov/coronavirus/2019-ncov/faq.html#COVID19animals Monitor your symptoms.  Common symptoms of COVID-19 include fever and cough. Trouble breathing is a more serious symptom that means you should get medical attention.  Follow care instructions from your healthcare provider and local health department. Your local health authorities will give instructions on checking your symptoms and reporting information. If you develop emergency warning signs for COVID-19 get medical attention immediately.  Emergency warning signs include*:  Trouble breathing  Persistent pain or pressure in the chest  New confusion or not able to be woken  Bluish lips or face *This list is not all inclusive. Please consult your medical provider for any other symptoms that are severe or concerning to you. Call 911 if you have a medical  emergency. If you have a medical emergency and need to call 911, notify the operator that you have or think you might have, COVID-19. If possible, put on a facemask before medical help arrives. Call ahead before visiting your doctor.  Call ahead. Many medical visits for routine care are being postponed or done by phone or telemedicine.  If you have a medical appointment that cannot be postponed, call your doctor's office. This will help the office protect themselves and other patients. If you are sick, wear a cloth covering over your nose and mouth.  You should wear a cloth face covering over your nose and mouth if you must be around other people or animals, including pets (even at home).  You don't need to wear the cloth face covering if you are alone. If you can't put on a cloth face covering (because of trouble breathing for example), cover your coughs and sneezes in some other way. Try to stay at least 6 feet away from other people. This will help protect the people around you. Note: During the COVID-19 pandemic, medical grade facemasks are reserved for healthcare workers and some first responders. You may need to make a cloth face covering using a scarf or bandana. Cover your coughs and sneezes.  Cover your mouth and nose with a tissue when you cough or sneeze.  Throw used tissues in a lined trash can.  Immediately wash your hands with soap and water for at least 20 seconds. If soap and water are not available, clean your hands with an alcohol-based hand sanitizer that contains at least 60% alcohol. Clean your hands often.    Wash your hands often with soap and water for at least 20 seconds. This is especially important after blowing your nose, coughing, or sneezing; going to the bathroom; and before eating or preparing food.  Use hand sanitizer if soap and water are not available. Use an alcohol-based hand sanitizer with at least 60% alcohol, covering all surfaces of your hands and rubbing  them together until they feel dry.  Soap and water are the best option, especially if your hands are visibly dirty.  Avoid touching your eyes, nose, and mouth with unwashed hands. Avoid sharing personal household items.  Do not share dishes, drinking glasses, cups, eating utensils, towels, or bedding with other people in your home.  Wash these items thoroughly after using them with soap and water or put them in the dishwasher. Clean all "high-touch" surfaces everyday.  Clean and disinfect high-touch surfaces in your "sick room" and bathroom. Let someone else clean and disinfect surfaces in common areas, but not your bedroom and bathroom.  If a caregiver or other person needs to clean and disinfect a sick person's bedroom or bathroom, they should do so on an as-needed basis. The caregiver/other person should wear a mask and wait as long as possible after the sick person has used the bathroom. High-touch surfaces include phones, remote controls, counters, tabletops, doorknobs, bathroom fixtures, toilets, keyboards, tablets, and bedside tables.  Clean and disinfect areas that may have blood, stool, or body fluids on them.  Use household cleaners and disinfectants. Clean the area or item with soap and water or another detergent if it is dirty. Then use a household disinfectant. ? Be sure to follow the instructions on the label to ensure safe and effective use of the product. Many products recommend keeping the surface wet for several minutes to ensure germs are killed. Many also recommend precautions such as wearing gloves and making sure you have good ventilation during use of the product. ? Most EPA-registered household disinfectants should be effective. How to discontinue home isolation  People with COVID-19 who have stayed home (home isolated) can stop home isolation under the following conditions: ? If you will not have a test to determine if you are still contagious, you can leave home  after these three things have happened:  You have had no fever for at least 72 hours (that is three full days of no fever without the use of medicine that reduces fevers) AND  other symptoms have improved (for example, when your cough or shortness of breath has improved) AND  at least 10 days have passed since your symptoms first appeared. ? If you will be tested to determine if you are still contagious, you can leave home after these three things have happened:  You no longer have a fever (without the use of medicine that reduces fevers) AND  other symptoms have improved (for example, when your cough or shortness of breath has improved) AND  you received two negative tests in a row, 24 hours apart. Your doctor will follow CDC guidelines. In all cases, follow the guidance of your healthcare provider and local health department. The decision to stop home isolation should be made in consultation with your healthcare provider and state and local health departments. Local decisions depend on local circumstances. cdc.gov/coronavirus 06/13/2018 This information is not intended to replace advice given to you by your health care provider. Make sure you discuss any questions you have with your health care provider. Document Released: 05/25/2018 Document Revised: 06/23/2018 Document Reviewed: 05/25/2018   Elsevier Patient Education  2020 [employer] Inc.  

## 2018-10-21 ENCOUNTER — Telehealth: Payer: Self-pay | Admitting: Family Medicine

## 2018-10-21 NOTE — Telephone Encounter (Signed)
Patient notified of x ray results and verbalized understanding.

## 2019-01-14 ENCOUNTER — Other Ambulatory Visit: Payer: Self-pay | Admitting: Family Medicine

## 2019-01-14 DIAGNOSIS — L209 Atopic dermatitis, unspecified: Secondary | ICD-10-CM

## 2019-01-14 MED ORDER — TRIAMCINOLONE ACETONIDE 0.1 % EX CREA: 1.0000 "application " | TOPICAL_CREAM | Freq: Every day | CUTANEOUS | 2 refills | Status: DC

## 2019-01-14 NOTE — Telephone Encounter (Signed)
I sent in the refill for the patient. 

## 2019-01-14 NOTE — Telephone Encounter (Signed)
Patient aware.

## 2019-01-31 ENCOUNTER — Ambulatory Visit (INDEPENDENT_AMBULATORY_CARE_PROVIDER_SITE_OTHER): Payer: Medicaid Other | Admitting: Family

## 2019-01-31 ENCOUNTER — Encounter: Payer: Self-pay | Admitting: Family

## 2019-01-31 DIAGNOSIS — J4521 Mild intermittent asthma with (acute) exacerbation: Secondary | ICD-10-CM

## 2019-01-31 DIAGNOSIS — L209 Atopic dermatitis, unspecified: Secondary | ICD-10-CM

## 2019-01-31 MED ORDER — BENZONATATE 200 MG PO CAPS: 200.0000 mg | ORAL_CAPSULE | Freq: Three times a day (TID) | ORAL | 1 refills | Status: DC | PRN

## 2019-01-31 MED ORDER — ALBUTEROL SULFATE HFA 108 (90 BASE) MCG/ACT IN AERS: 1.0000 | INHALATION_SPRAY | Freq: Four times a day (QID) | RESPIRATORY_TRACT | 2 refills | Status: DC | PRN

## 2019-01-31 MED ORDER — TRIAMCINOLONE ACETONIDE 0.025 % EX CREA: TOPICAL_CREAM | CUTANEOUS | 0 refills | Status: DC

## 2019-01-31 MED ORDER — CETIRIZINE HCL 10 MG PO TABS: 10.0000 mg | ORAL_TABLET | Freq: Every day | ORAL | 11 refills | Status: DC

## 2019-01-31 MED ORDER — PREDNISONE 10 MG (21) PO TBPK: ORAL_TABLET | ORAL | 0 refills | Status: DC

## 2019-01-31 NOTE — Progress Notes (Signed)
Virtual Visit via telephone Note Due to COVID-19 pandemic this visit was conducted virtually. This visit type was conducted due to national recommendations for restrictions regarding the COVID-19 Pandemic (e.g. social distancing, sheltering in place) in an effort to limit this patient's exposure and mitigate transmission in our community. All issues noted in this document were discussed and addressed.  A physical exam was not performed with this format.  I connected with Leah Carroll on 01/31/19 at 1:35 pm  by telephone and verified that I am speaking with the correct person using two identifiers. Leah Carroll is currently located at home  and no one is currently with her during visit. The provider, Jannifer Rodney, FNP is located in their office at time of visit.  I discussed the limitations, risks, security and privacy concerns of performing an evaluation and management service by telephone and the availability of in person appointments. I also discussed with the patient that there may be a patient responsible charge related to this service. The patient expressed understanding and agreed to proceed.   History and Present Illness:  Asthma She complains of chest tightness, cough, shortness of breath and sputum production. There is no difficulty breathing or wheezing. This is a chronic problem. The current episode started more than 1 year ago. The problem occurs intermittently. The problem has been waxing and waning. The cough is productive of sputum. Her symptoms are alleviated by leukotriene antagonist. She reports moderate improvement on treatment. Her symptoms are not alleviated by rest. Her past medical history is significant for asthma.  Rash This is a chronic problem. The current episode started more than 1 year ago. The problem has been waxing and waning since onset. The affected locations include the left lower leg and left upper leg. The rash is characterized by dryness, itchiness  and redness. Associated symptoms include coughing and shortness of breath. Past treatments include topical steroids. The treatment provided moderate relief. Her past medical history is significant for asthma and eczema.      Review of Systems  Respiratory: Positive for cough, sputum production and shortness of breath. Negative for wheezing.   Skin: Positive for rash.  All other systems reviewed and are negative.    Observations/Objective: No SOB or distress noted   Assessment and Plan: 1. Mild intermittent asthma with exacerbation Restart zyrtec daily Avoid allergens Call if symptoms worsen or do not improve, may need to go on daily inhaler - cetirizine (ZYRTEC) 10 MG tablet; Take 1 tablet (10 mg total) by mouth daily.  Dispense: 30 tablet; Refill: 11 - albuterol (VENTOLIN HFA) 108 (90 Base) MCG/ACT inhaler; Inhale 1-2 puffs into the lungs every 6 (six) hours as needed for wheezing or shortness of breath.  Dispense: 8 g; Refill: 2 - benzonatate (TESSALON) 200 MG capsule; Take 1 capsule (200 mg total) by mouth 3 (three) times daily as needed.  Dispense: 30 capsule; Refill: 1 - triamcinolone (KENALOG) 0.025 % cream; APPLY CREAM 2 TIMES A DAY.  Dispense: 454 g; Refill: 0 - predniSONE (STERAPRED UNI-PAK 21 TAB) 10 MG (21) TBPK tablet; Use as directed  Dispense: 21 tablet; Refill: 0  2. Atopic dermatitis, unspecified type - triamcinolone (KENALOG) 0.025 % cream; APPLY CREAM 2 TIMES A DAY.  Dispense: 454 g; Refill: 0     I discussed the assessment and treatment plan with the patient. The patient was provided an opportunity to ask questions and all were answered. The patient agreed with the plan and demonstrated an understanding of the  instructions.   The patient was advised to call back or seek an in-person evaluation if the symptoms worsen or if the condition fails to improve as anticipated.  The above assessment and management plan was discussed with the patient. The patient verbalized  understanding of and has agreed to the management plan. Patient is aware to call the clinic if symptoms persist or worsen. Patient is aware when to return to the clinic for a follow-up visit. Patient educated on when it is appropriate to go to the emergency department.   Time call ended:  1:46 pm   I provided 11 minutes of non-face-to-face time during this encounter.    Evelina Dun, FNP

## 2019-04-23 DIAGNOSIS — M79671 Pain in right foot: Secondary | ICD-10-CM | POA: Diagnosis not present

## 2019-04-23 DIAGNOSIS — M79674 Pain in right toe(s): Secondary | ICD-10-CM | POA: Diagnosis not present

## 2019-04-23 DIAGNOSIS — R600 Localized edema: Secondary | ICD-10-CM | POA: Diagnosis not present

## 2019-05-02 ENCOUNTER — Encounter: Payer: Self-pay | Admitting: Advanced Practice Midwife

## 2019-05-02 ENCOUNTER — Ambulatory Visit (INDEPENDENT_AMBULATORY_CARE_PROVIDER_SITE_OTHER): Payer: Medicaid Other | Admitting: Advanced Practice Midwife

## 2019-05-02 ENCOUNTER — Other Ambulatory Visit: Payer: Self-pay

## 2019-05-02 VITALS — BP 122/77 | HR 80 | Ht 59.0 in | Wt 162.0 lb

## 2019-05-02 DIAGNOSIS — Z3202 Encounter for pregnancy test, result negative: Secondary | ICD-10-CM | POA: Diagnosis not present

## 2019-05-02 DIAGNOSIS — N970 Female infertility associated with anovulation: Secondary | ICD-10-CM

## 2019-05-02 DIAGNOSIS — Z113 Encounter for screening for infections with a predominantly sexual mode of transmission: Secondary | ICD-10-CM | POA: Diagnosis not present

## 2019-05-02 LAB — POCT URINE PREGNANCY: Preg Test, Ur: NEGATIVE
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
## 2019-05-02 NOTE — Patient Instructions (Signed)
Anovulatory cycle

## 2019-05-03 LAB — RPR: RPR Ser Ql: NONREACTIVE

## 2019-05-03 LAB — HIV ANTIBODY (ROUTINE TESTING W REFLEX): HIV Screen 4th Generation wRfx: NONREACTIVE

## 2019-05-04 LAB — NUSWAB VAGINITIS PLUS (VG+)
Atopobium vaginae: HIGH Score — AB
BVAB 2: HIGH Score — AB
Candida albicans, NAA: NEGATIVE
Candida glabrata, NAA: NEGATIVE
Chlamydia trachomatis, NAA: NEGATIVE
Megasphaera 1: HIGH Score — AB
Neisseria gonorrhoeae, NAA: NEGATIVE
Trich vag by NAA: NEGATIVE

## 2019-05-05 ENCOUNTER — Other Ambulatory Visit: Payer: Self-pay | Admitting: Advanced Practice Midwife

## 2019-05-05 MED ORDER — METRONIDAZOLE 500 MG PO TABS: 500.0000 mg | ORAL_TABLET | Freq: Two times a day (BID) | ORAL | 0 refills | Status: DC

## 2019-05-05 NOTE — Progress Notes (Signed)
Flagyl fo rbv 

## 2019-05-25 ENCOUNTER — Other Ambulatory Visit: Payer: Self-pay | Admitting: Women's Health

## 2019-05-25 MED ORDER — MEGESTROL ACETATE 40 MG PO TABS: ORAL_TABLET | ORAL | 1 refills | Status: DC

## 2019-06-01 DIAGNOSIS — B001 Herpesviral vesicular dermatitis: Secondary | ICD-10-CM | POA: Diagnosis not present

## 2019-06-27 DIAGNOSIS — R1110 Vomiting, unspecified: Secondary | ICD-10-CM | POA: Diagnosis not present

## 2019-06-27 DIAGNOSIS — R112 Nausea with vomiting, unspecified: Secondary | ICD-10-CM | POA: Diagnosis not present

## 2019-06-27 DIAGNOSIS — R519 Headache, unspecified: Secondary | ICD-10-CM | POA: Diagnosis not present

## 2019-08-26 ENCOUNTER — Other Ambulatory Visit: Payer: Medicaid Other

## 2019-09-07 ENCOUNTER — Other Ambulatory Visit: Payer: Self-pay | Admitting: Family Medicine

## 2019-09-07 ENCOUNTER — Other Ambulatory Visit: Payer: Self-pay | Admitting: *Deleted

## 2019-09-07 DIAGNOSIS — L209 Atopic dermatitis, unspecified: Secondary | ICD-10-CM

## 2019-09-07 DIAGNOSIS — J4521 Mild intermittent asthma with (acute) exacerbation: Secondary | ICD-10-CM

## 2019-09-07 MED ORDER — TRIAMCINOLONE ACETONIDE 0.025 % EX CREA: TOPICAL_CREAM | CUTANEOUS | 1 refills | Status: DC

## 2019-09-07 MED ORDER — TRIAMCINOLONE ACETONIDE 0.025 % EX CREA: TOPICAL_CREAM | CUTANEOUS | 0 refills | Status: DC

## 2019-09-07 NOTE — Telephone Encounter (Signed)
  Prescription Request  09/07/2019  What is the name of the medication or equipment? Ezcema Cream  Have you contacted your pharmacy to request a refill? (if applicable) No  Which pharmacy would you like this sent to? [facility]   Patient notified that their request is being sent to the clinical staff for review and that they should receive a response within 2 business days.   [facility] pt.  Please call pt.

## 2019-10-19 ENCOUNTER — Telehealth: Payer: Self-pay | Admitting: Family Medicine

## 2019-10-19 DIAGNOSIS — L209 Atopic dermatitis, unspecified: Secondary | ICD-10-CM

## 2019-10-19 DIAGNOSIS — J4521 Mild intermittent asthma with (acute) exacerbation: Secondary | ICD-10-CM

## 2019-10-19 NOTE — Telephone Encounter (Signed)
  Prescription Request  10/19/2019  What is the name of the medication or equipment? Eczema Cream  Have you contacted your pharmacy to request a refill? (if applicable) Yes  Which pharmacy would you like this sent to? [facility]  Pt says she is out of her eczema cream and needs refills. Pt made appt to see Harlow Mares on 10/25/19 since Dr Nadine Counts doesn't have any appt slots available until end of October. Pt wants to know if she needs to keep this appt to get a refill or if Dr Nadine Counts can just send in refill for it without appt?   Patient notified that their request is being sent to the clinical staff for review and that they should receive a response within 2 business days.

## 2019-10-19 NOTE — Telephone Encounter (Signed)
Pt is good, picked up refill today

## 2019-10-25 ENCOUNTER — Encounter: Payer: Self-pay | Admitting: Family Medicine

## 2019-10-25 ENCOUNTER — Other Ambulatory Visit: Payer: Self-pay

## 2019-10-25 ENCOUNTER — Ambulatory Visit (INDEPENDENT_AMBULATORY_CARE_PROVIDER_SITE_OTHER): Payer: Medicaid Other | Admitting: Family Medicine

## 2019-10-25 VITALS — BP 113/71 | HR 60 | Temp 97.5°F | Ht 59.0 in | Wt 153.6 lb

## 2019-10-25 DIAGNOSIS — L209 Atopic dermatitis, unspecified: Secondary | ICD-10-CM | POA: Diagnosis not present

## 2019-10-25 DIAGNOSIS — J4521 Mild intermittent asthma with (acute) exacerbation: Secondary | ICD-10-CM

## 2019-10-25 DIAGNOSIS — Z111 Encounter for screening for respiratory tuberculosis: Secondary | ICD-10-CM

## 2019-10-25 MED ORDER — TRIAMCINOLONE ACETONIDE 0.025 % EX CREA: TOPICAL_CREAM | CUTANEOUS | 1 refills | Status: DC

## 2019-10-25 MED ORDER — TRIAMCINOLONE ACETONIDE 0.025 % EX CREA: TOPICAL_CREAM | CUTANEOUS | 2 refills | Status: DC
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
## 2019-10-25 NOTE — Patient Instructions (Signed)

## 2019-10-27 ENCOUNTER — Ambulatory Visit: Payer: Medicaid Other

## 2019-10-27 LAB — QUANTIFERON-TB GOLD PLUS
QuantiFERON Mitogen Value: >10 IU/mL
QuantiFERON Nil Value: 0.02 IU/mL
QuantiFERON TB1 Ag Value: 0.02 IU/mL
QuantiFERON TB2 Ag Value: 0.03 IU/mL
QuantiFERON-TB Gold Plus: NEGATIVE

## 2019-11-10 DIAGNOSIS — Z202 Contact with and (suspected) exposure to infections with a predominantly sexual mode of transmission: Secondary | ICD-10-CM | POA: Diagnosis not present

## 2019-11-14 ENCOUNTER — Ambulatory Visit: Payer: Medicaid Other | Admitting: Adult Health

## 2020-01-10 DIAGNOSIS — N898 Other specified noninflammatory disorders of vagina: Secondary | ICD-10-CM | POA: Diagnosis not present

## 2020-01-12 ENCOUNTER — Other Ambulatory Visit: Payer: Self-pay

## 2020-01-13 ENCOUNTER — Other Ambulatory Visit: Payer: Self-pay | Admitting: Adult Health

## 2020-01-22 DIAGNOSIS — N898 Other specified noninflammatory disorders of vagina: Secondary | ICD-10-CM | POA: Diagnosis not present

## 2020-01-22 DIAGNOSIS — N760 Acute vaginitis: Secondary | ICD-10-CM | POA: Diagnosis not present

## 2020-02-16 DIAGNOSIS — Z20822 Contact with and (suspected) exposure to covid-19: Secondary | ICD-10-CM | POA: Diagnosis not present

## 2020-02-19 DIAGNOSIS — Z20822 Contact with and (suspected) exposure to covid-19: Secondary | ICD-10-CM | POA: Diagnosis not present

## 2020-03-09 ENCOUNTER — Telehealth: Payer: Self-pay | Admitting: Family Medicine

## 2020-03-09 NOTE — Telephone Encounter (Signed)
Pt stated that there is a red spot on her top lip that has been there for a while but she is unsure of how long it has been there. No pain or itching.

## 2020-03-09 NOTE — Telephone Encounter (Signed)
Patient states that she has a dry red patch above her top lip.  Place does not itch or hurt.  Patient states that she uses aquafor with relief but does not use daily.  Advised patient to use aquafor daily and to contact this office if does not get better

## 2020-03-17 DIAGNOSIS — N898 Other specified noninflammatory disorders of vagina: Secondary | ICD-10-CM | POA: Diagnosis not present

## 2020-03-17 DIAGNOSIS — N760 Acute vaginitis: Secondary | ICD-10-CM | POA: Diagnosis not present

## 2020-04-14 DIAGNOSIS — R7301 Impaired fasting glucose: Secondary | ICD-10-CM | POA: Diagnosis not present

## 2020-04-14 DIAGNOSIS — B373 Candidiasis of vulva and vagina: Secondary | ICD-10-CM | POA: Diagnosis not present

## 2020-05-03 ENCOUNTER — Ambulatory Visit: Payer: Medicaid Other | Admitting: Family Medicine

## 2020-05-03 ENCOUNTER — Encounter: Payer: Self-pay | Admitting: Family Medicine

## 2020-05-03 DIAGNOSIS — L209 Atopic dermatitis, unspecified: Secondary | ICD-10-CM | POA: Diagnosis not present

## 2020-05-03 MED ORDER — TRIAMCINOLONE ACETONIDE 0.025 % EX CREA: TOPICAL_CREAM | CUTANEOUS | 2 refills | Status: AC

## 2020-05-03 NOTE — Progress Notes (Signed)
   Virtual Visit via telephone Note Due to COVID-19 pandemic this visit was conducted virtually. This visit type was conducted due to national recommendations for restrictions regarding the COVID-19 Pandemic (e.g. social distancing, sheltering in place) in an effort to limit this patient's exposure and mitigate transmission in our community. All issues noted in this document were discussed and addressed.  A physical exam was not performed with this format.  I connected with Leah Carroll on 05/03/20 at [phone] by telephone and verified that I am speaking with the correct person using two identifiers. Leah Carroll is currently located at home and no one is currently with her during the visit. The provider, Gabriel Earing, FNP is located in their office at time of visit.  I discussed the limitations, risks, security and privacy concerns of performing an evaluation and management service by telephone and the availability of in person appointments. I also discussed with the patient that there may be a patient responsible charge related to this service. The patient expressed understanding and agreed to proceed.  CC: eczema  History and Present Illness:  HPI  Leah Carroll has a history of eczema. She reports a flare for the last 2-3 days. The areas on located on her back and the creases of her legs. It is itchy. She denies fever or drainage. She is out of her kenalog cream and would like a refill as it typically controls her symptoms well.    ROS As per HPI.   Observations/Objective: Alert and oriented x 3. Able to speak in full sentences without difficulty.   Assessment and Plan: Leah Carroll was seen today for eczema.  Diagnoses and all orders for this visit:  Atopic dermatitis, unspecified type Refill provided as below. Return to office for new or worsening symptoms, or if symptoms persist.  -     triamcinolone (KENALOG) 0.025 % cream; APPLY CREAM to affected area 2 TIMES A DAY.   Follow  Up Instructions: As needed.     I discussed the assessment and treatment plan with the patient. The patient was provided an opportunity to ask questions and all were answered. The patient agreed with the plan and demonstrated an understanding of the instructions.   The patient was advised to call back or seek an in-person evaluation if the symptoms worsen or if the condition fails to improve as anticipated.  The above assessment and management plan was discussed with the patient. The patient verbalized understanding of and has agreed to the management plan. Patient is aware to call the clinic if symptoms persist or worsen. Patient is aware when to return to the clinic for a follow-up visit. Patient educated on when it is appropriate to go to the emergency department.   Time call ended:  0924  I provided 6 minutes of phone time during this encounter.    Gabriel Earing, FNP

## 2020-05-11 DIAGNOSIS — R410 Disorientation, unspecified: Secondary | ICD-10-CM | POA: Diagnosis not present

## 2020-05-11 DIAGNOSIS — R4182 Altered mental status, unspecified: Secondary | ICD-10-CM | POA: Diagnosis not present

## 2020-05-11 DIAGNOSIS — R55 Syncope and collapse: Secondary | ICD-10-CM | POA: Diagnosis not present

## 2020-06-19 DIAGNOSIS — B373 Candidiasis of vulva and vagina: Secondary | ICD-10-CM | POA: Diagnosis not present

## 2020-06-19 DIAGNOSIS — N898 Other specified noninflammatory disorders of vagina: Secondary | ICD-10-CM | POA: Diagnosis not present

## 2020-06-29 ENCOUNTER — Ambulatory Visit: Payer: Medicaid Other | Admitting: Adult Health

## 2020-07-24 ENCOUNTER — Telehealth: Payer: Self-pay | Admitting: Family Medicine

## 2020-07-31 ENCOUNTER — Encounter: Payer: Self-pay | Admitting: Nurse Practitioner

## 2020-07-31 ENCOUNTER — Other Ambulatory Visit: Payer: Self-pay

## 2020-07-31 ENCOUNTER — Ambulatory Visit: Payer: Medicaid Other | Admitting: Nurse Practitioner

## 2020-07-31 VITALS — BP 124/73 | HR 60 | Temp 98.0°F | Resp 20 | Ht 59.0 in | Wt 150.0 lb

## 2020-07-31 DIAGNOSIS — L309 Dermatitis, unspecified: Secondary | ICD-10-CM

## 2020-07-31 MED ORDER — PREDNISONE 10 MG (21) PO TBPK: ORAL_TABLET | ORAL | 0 refills | Status: DC

## 2020-07-31 NOTE — Progress Notes (Signed)
   Subjective:    Patient ID: Leah Carroll, female    DOB: 06-03-2000, 20 y.o.   MRN: [id number]   Chief Complaint: Eczema   HPI Patient comes in c/o eczema flare up. Has gotten worse the last 3 days. Has been using triamcinolone cream which is not helping to get it under control.     Review of Systems  Constitutional: Negative.   All other systems reviewed and are negative.     Objective:   Physical Exam Vitals and nursing note reviewed.  Constitutional:      Appearance: Normal appearance.  Cardiovascular:     Rate and Rhythm: Normal rate and regular rhythm.     Heart sounds: Normal heart sounds.  Skin:    General: Skin is warm.     Findings: Rash (erythematous dry patchy areason bil forearms, abdomne and thighs.) present.  Neurological:     Mental Status: She is alert.    BP 124/73   Pulse 60   Temp 98 F (36.7 C) (Temporal)   Resp 20   Ht 4\' 11"  (1.499 m)   Wt 150 lb (68 kg)   SpO2 99%   BMI 30.30 kg/m        Assessment & Plan:  [name] in today with chief complaint of Eczema   1. Eczema of both upper extremities Avoid hot showers Avoid scratching Meds ordered this encounter  Medications   predniSONE (STERAPRED UNI-PAK 21 TAB) 10 MG (21) TBPK tablet    Sig: As directed x 6 days    Dispense:  21 tablet    Refill:  0    Order Specific Question:   Supervising Provider    Answer:   A [[id number]]       The above assessment and management plan was discussed with the patient. The patient verbalized understanding of and has agreed to the management plan. Patient is aware to call the clinic if symptoms persist or worsen. Patient is aware when to return to the clinic for a follow-up visit. Patient educated on when it is appropriate to go to the emergency department.   Mary-Margaret Arville Care, FNP

## 2020-07-31 NOTE — Patient Instructions (Signed)
Eczema Eczema refers to a group of skin conditions that cause skin to become rough and inflamed. Each type of eczema has different triggers, symptoms, and treatments. Eczema of any type is usually itchy. Symptoms range from mild to severe. Eczema is not spread from person to person (is not contagious). It can appear on different parts of the body at different times. One person's eczema may look different from another person's eczema. What are the causes? The exact cause of this condition is not known. However, exposure to certain environmental factors, irritants, and allergens can make the condition worse. What are the signs or symptoms? Symptoms of this condition depend on the type of eczema you have. The types include: Contact dermatitis. There are two kinds: Irritant contact dermatitis. This happens when something irritates the skin and causes a rash. Allergic contact dermatitis. This happens when your skin comes in contact with something you are allergic to (allergens). This can include poison ivy, chemicals, or medicines that were applied to your skin. Atopic dermatitis. This is a long-term (chronic) skin disease that keeps coming back (recurring). It is the most common type of eczema. Usual symptoms are a red rash and itchy, dry, scaly skin. It usually starts showing signs in infancy and can last through adulthood. Dyshidrotic eczema. This is a form of eczema on the hands and feet. It shows up as very itchy, fluid-filled blisters. It can affect people of any age but is more common before age 40. Hand eczema. This causes very itchy areas of skin on the palms and sides of the hands and fingers. This type of eczema is common in industrial jobs where you may be exposed to different types of irritants. Lichen simplex chronicus. This type of eczema occurs when a person constantly scratches one area of the body. Repeated scratching of the area leads to thickened skin (lichenification). This condition can  accompany other types of eczema. It is more common in adults but may also be seen in children. Nummular eczema. This is a common type of eczema that most often affects the lower legs and the backs of the hands. It typically causes an itchy, red, circular, crusty lesion (plaque). Scratching may become a habit and can cause bleeding. Nummular eczema occurs most often in middle-aged or older people. Seborrheic dermatitis. This is a common skin disease that mainly affects the scalp. It may also affect other oily areas of the body, such as the face, sides of the nose, eyebrows, ears, eyelids, and chest. It is marked by small scaling and redness of the skin (erythema). This can affect people of all ages. In infants, this condition is called cradle cap. Stasis dermatitis. This is a common skin disease that can cause itching, scaling, and hyperpigmentation, usually on the legs and feet. It occurs most often in people who have a condition that prevents blood from being pumped through the veins in the legs (chronic venous insufficiency). Stasis dermatitis is a chronic condition that needs long-term management. How is this diagnosed? This condition may be diagnosed based on: A physical exam of your skin. Your medical history. Skin patch tests. These tests involve using patches that contain possible allergens and placing them on your back. Your health care provider will check in a few days to see if an allergic reaction occurred. How is this treated? Treatment for eczema is based on the type of eczema you have. You may be given hydrocortisone steroid medicine or antihistamines. These can relieve itching quickly and help reduce inflammation.   These may be prescribed or purchased over the counter, depending on the strength that is needed. Follow these instructions at home: Take or apply over-the-counter and prescription medicines only as told by your health care provider. Use creams or ointments to moisturize your  skin. Do not use lotions. Learn what triggers or irritates your symptoms so you can avoid these things. Treat symptom flare-ups quickly. Do not scratch your skin. This can make your rash worse. Keep all follow-up visits. This is important. Where to find more information American Academy of Dermatology: aad.org National Eczema Association: nationaleczema.org The Society for Pediatric Dermatology: pedsderm.net Contact a health care provider if: You have severe itching, even with treatment. You scratch your skin regularly until it bleeds. Your rash looks different than usual. Your skin is painful, swollen, or more red than usual. You have a fever. Summary Eczema refers to a group of skin conditions that cause skin to become rough and inflamed. Each type has different triggers. Eczema of any type causes itching that may range from mild to severe. Treatment varies based on the type of eczema you have. Hydrocortisone steroid medicine or antihistamines can help with itching and inflammation. Protecting your skin is the best way to prevent eczema. Use creams or ointments to moisturize your skin. Avoid triggers and irritants. Treat flare-ups quickly. This information is not intended to replace advice given to you by your health care provider. Make sure you discuss any questions you have with your health care provider. Document Revised: 11/07/2019 Document Reviewed: 11/07/2019 Elsevier Patient Education  2022 Elsevier Inc.  

## 2020-11-09 DIAGNOSIS — R350 Frequency of micturition: Secondary | ICD-10-CM | POA: Diagnosis not present

## 2020-11-09 DIAGNOSIS — N390 Urinary tract infection, site not specified: Secondary | ICD-10-CM | POA: Diagnosis not present

## 2020-11-09 DIAGNOSIS — Z3202 Encounter for pregnancy test, result negative: Secondary | ICD-10-CM | POA: Diagnosis not present

## 2020-11-09 DIAGNOSIS — R319 Hematuria, unspecified: Secondary | ICD-10-CM | POA: Diagnosis not present

## 2021-01-28 ENCOUNTER — Ambulatory Visit: Payer: Medicaid Other | Admitting: Nurse Practitioner

## 2021-01-28 ENCOUNTER — Encounter: Payer: Self-pay | Admitting: Nurse Practitioner

## 2021-01-28 VITALS — BP 114/68 | HR 70 | Temp 97.9°F | Resp 20 | Ht 59.0 in | Wt 158.0 lb

## 2021-01-28 DIAGNOSIS — M26621 Arthralgia of right temporomandibular joint: Secondary | ICD-10-CM | POA: Diagnosis not present

## 2021-01-28 MED ORDER — NAPROXEN 500 MG PO TABS: 500.0000 mg | ORAL_TABLET | Freq: Two times a day (BID) | ORAL | 1 refills | Status: DC

## 2021-01-28 NOTE — Progress Notes (Signed)
° °  Subjective:    Patient ID: Leah Carroll, female    DOB: 07-28-2000, 20 y.o.   MRN: [id number]   Chief Complaint: Painful to open mouth to eat   HPI Patient comes in today c/o right side of mouth hurting. Has right sided jaw pain when opening her mouth. Started a couple of days ago. Has had in th epast but seems to have worsened    Review of Systems  Constitutional:  Negative for diaphoresis.  Eyes:  Negative for pain.  Respiratory:  Negative for shortness of breath.   Cardiovascular:  Negative for chest pain, palpitations and leg swelling.  Gastrointestinal:  Negative for abdominal pain.  Endocrine: Negative for polydipsia.  Skin:  Negative for rash.  Neurological:  Negative for dizziness, weakness and headaches.  Hematological:  Does not bruise/bleed easily.  All other systems reviewed and are negative.     Objective:   Physical Exam Vitals and nursing note reviewed.  Constitutional:      Appearance: Normal appearance.  HENT:     Mouth/Throat:     Comments: Iimitied ROM of right jaw . Cardiovascular:     Rate and Rhythm: Normal rate and regular rhythm.     Heart sounds: Normal heart sounds.  Pulmonary:     Breath sounds: Normal breath sounds.  Skin:    General: Skin is warm.  Neurological:     General: No focal deficit present.     Mental Status: She is alert and oriented to person, place, and time.    BP 114/68    Pulse 70    Temp 97.9 F (36.6 C) (Temporal)    Resp 20    Ht 4\' 11"  (1.499 m)    Wt 158 lb (71.7 kg)    SpO2 98%    BMI 31.91 kg/m        Assessment & Plan:  Luanne A Napolitano in today with chief complaint of Painful to open mouth to eat   1. Arthralgia of right temporomandibular joint Moist heat Rest Avooid foods that require a lot of chewing No chewing gum RTO prn - naproxen (NAPROSYN) 500 MG tablet; Take 1 tablet (500 mg total) by mouth 2 (two) times daily with a meal.  Dispense: 60 tablet; Refill: 1    The above assessment  and management plan was discussed with the patient. The patient verbalized understanding of and has agreed to the management plan. Patient is aware to call the clinic if symptoms persist or worsen. Patient is aware when to return to the clinic for a follow-up visit. Patient educated on when it is appropriate to go to the emergency department.   Mary-Margaret , FNP

## 2021-01-28 NOTE — Patient Instructions (Signed)
Temporomandibular Joint Syndrome °Temporomandibular joint syndrome (TMJ syndrome) is a condition that causes pain in the temporomandibular joints. These joints are located near your ears and allow your jaw to open and close. For people with TMJ syndrome, chewing, biting, or other movements of the jaw can be difficult or painful. °TMJ syndrome is often mild and goes away within a few weeks. However, sometimes the condition becomes a long-term (chronic) problem. °What are the causes? °This condition may be caused by: °Grinding your teeth or clenching your jaw. Some people do this when they are stressed. °Arthritis. °An injury to the jaw. °A head or neck injury. °Teeth or dentures that are not aligned well. °In some cases, the cause of TMJ syndrome may not be known. °What are the signs or symptoms? °The most common symptom of this condition is aching pain on the side of the head in the area of the TMJ. Other symptoms may include: °Pain when moving your jaw, such as when chewing or biting. °Not being able to open your jaw all the way. °Making a clicking sound when you open your mouth. °Headache. °Earache. °Neck or shoulder pain. °How is this diagnosed? °This condition may be diagnosed based on: °Your symptoms and medical history. °A physical exam. Your health care provider may check the range of motion of your jaw. °Imaging tests, such as X-rays or an MRI. °You may also need to see your dentist, who will check if your teeth and jaw are lined up correctly. °How is this treated? °TMJ syndrome often goes away on its own. If treatment is needed, it may include: °Eating soft foods and applying ice or heat. °Medicines to relieve pain or inflammation. °Medicines or massage to relax the muscles. °A splint, bite plate, or mouthpiece to prevent teeth grinding or jaw clenching. °Relaxation techniques or counseling to help reduce stress. °A therapy for pain in which an electrical current is applied to the nerves through the skin  (transcutaneous electrical nerve stimulation). °Acupuncture. This may help to relieve pain. °Jaw surgery. This is rarely needed. °Follow these instructions at home: °Eating and drinking °Eat a soft diet if you are having trouble chewing. °Avoid foods that require a lot of chewing. Do not chew gum. °General instructions °Take over-the-counter and prescription medicines only as told by your health care provider. °If directed, put ice on the painful area. To do this: °Put ice in a plastic bag. °Place a towel between your skin and the bag. °Leave the ice on for 20 minutes, 2-3 times a day. °Remove the ice if your skin turns bright red. This is very important. If you cannot feel pain, heat, or cold, you have a greater risk of damage to the area. °Apply a warm, wet cloth (warm compress) to the painful area as told. °Massage your jaw area and do any jaw stretching exercises as told by your health care provider. °If you were given a splint, bite plate, or mouthpiece, wear it as told by your health care provider. °Keep all follow-up visits. This is important. °Where to find more information °National Institute of Dental and Craniofacial Research: www.nidcr.nih.gov °Contact a health care provider if: °You have trouble eating. °You have new or worsening symptoms. °Get help right away if: °Your jaw locks. °Summary °Temporomandibular joint syndrome (TMJ syndrome) is a condition that causes pain in the temporomandibular joints. These joints are located near your ears and allow your jaw to open and close. °TMJ syndrome is often mild and goes away within a few   weeks. However, sometimes the condition becomes a long-term (chronic) problem. °Symptoms include an aching pain on the side of the head in the area of the TMJ, pain when chewing or biting, and being unable to open your jaw all the way. You may also make a clicking sound when you open your mouth. °TMJ syndrome often goes away on its own. If treatment is needed, it may include  medicines to relieve pain, reduce inflammation, or relax the muscles. A splint, bite plate, or mouthpiece may also be used to prevent teeth grinding or jaw clenching. °This information is not intended to replace advice given to you by your health care provider. Make sure you discuss any questions you have with your health care provider. °Document Revised: 09/09/2020 Document Reviewed: 09/09/2020 °Elsevier Patient Education © 2022 Elsevier Inc. ° °

## 2021-06-14 DIAGNOSIS — B3731 Acute candidiasis of vulva and vagina: Secondary | ICD-10-CM | POA: Diagnosis not present

## 2021-08-12 ENCOUNTER — Telehealth: Payer: Self-pay | Admitting: Family Medicine

## 2021-08-12 NOTE — Telephone Encounter (Signed)
Spoke with patient and explained to her she will need to schedule an appointment to be seen.  She said she will call back to schedule.

## 2021-08-12 NOTE — Telephone Encounter (Signed)
  Prescription Request  08/12/2021  Is this a "Controlled Substance" medicine? no  Have you seen your PCP in the last 2 weeks? No, aware that she may need an appt but wanted a message put in first   If YES, route message to pool  -  If NO, patient needs to be scheduled for appointment.  What is the name of the medication or equipment? triamcinolone (KENALOG) 0.025 % cream  Have you contacted your pharmacy to request a refill? Yes    Which pharmacy would you like this sent to? [facility] - [place], [place] - [address]   Patient notified that their request is being sent to the clinical staff for review and that they should receive a response within 2 business days.

## 2021-09-01 IMAGING — DX DG CHEST 2V
2 series · 2 of 2 positions shown · non-contrast
Comparison: 01/20/2018

CLINICAL DATA: Shortness of breath

EXAM:
CHEST - 2 VIEW

[chest pa]
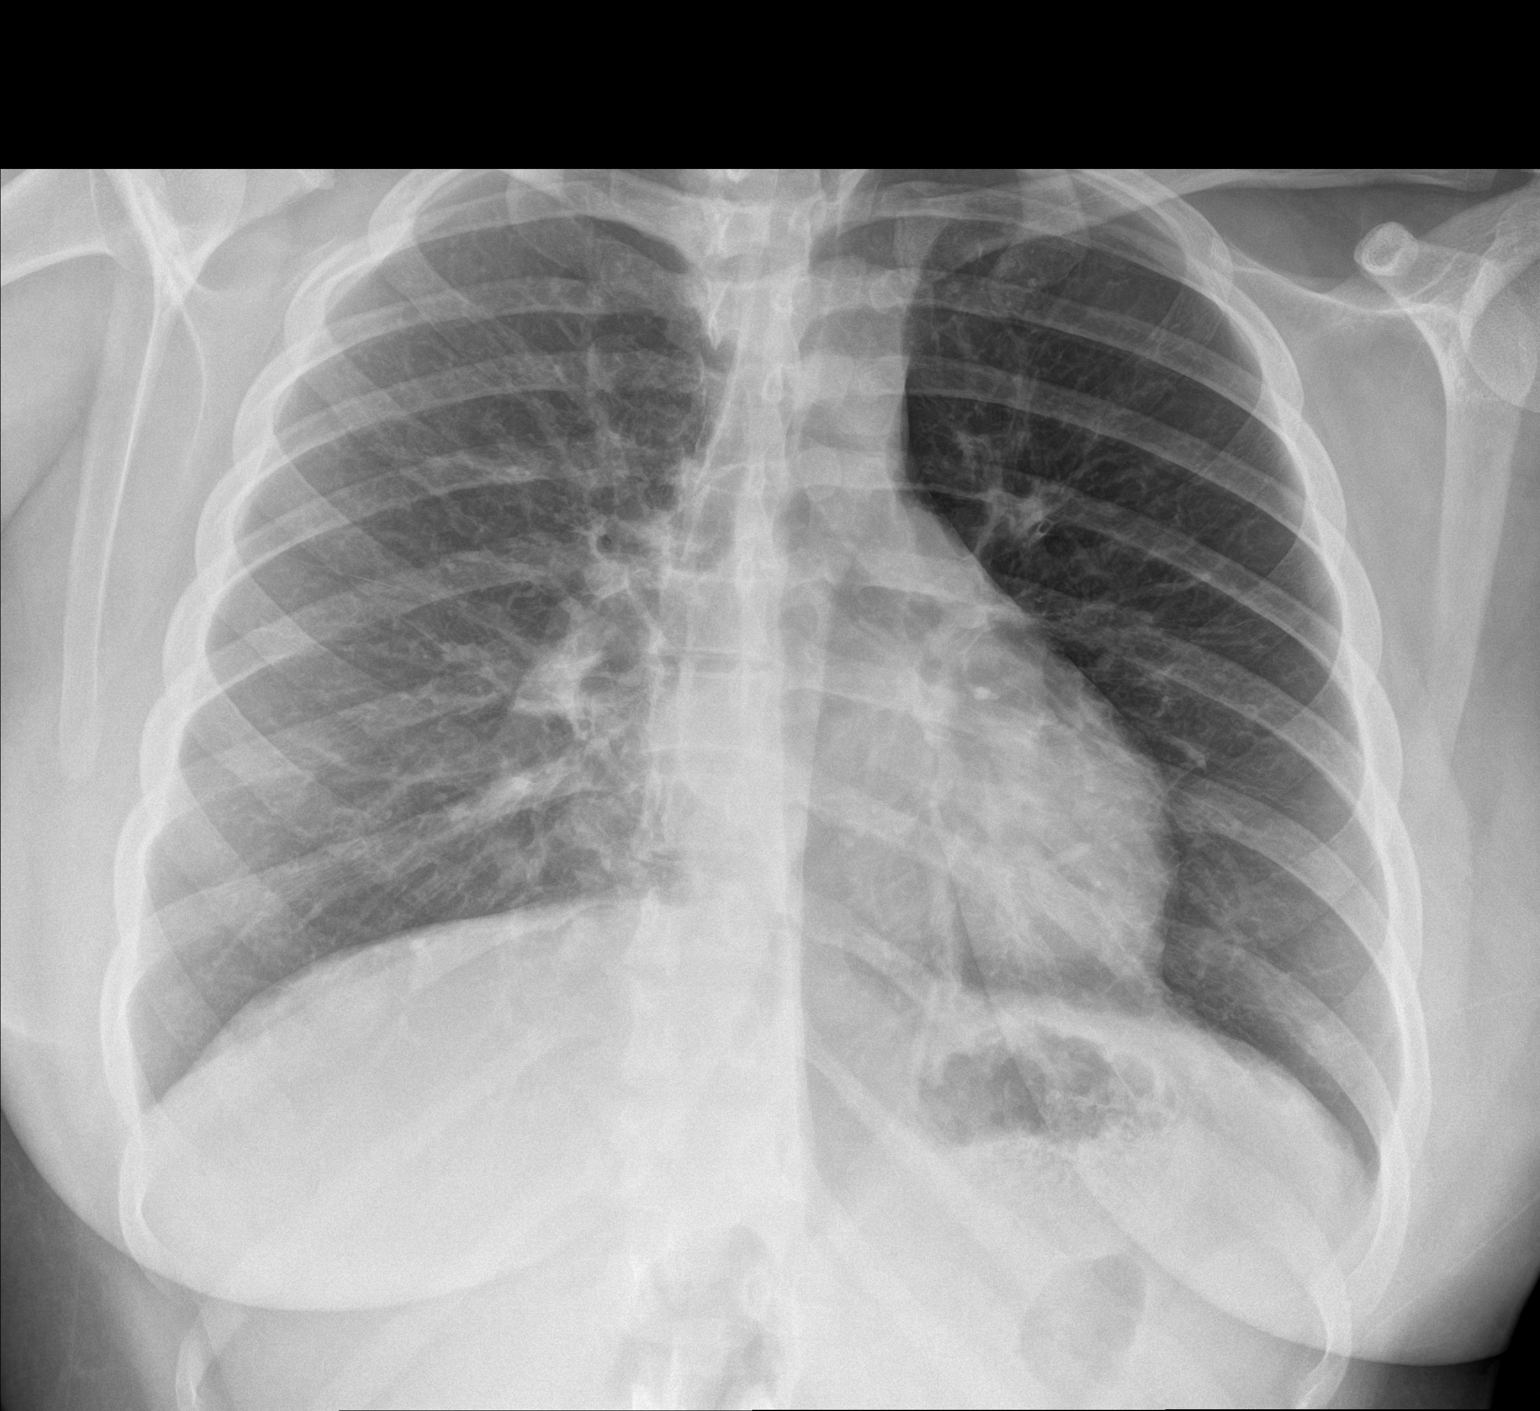

[chest lat]
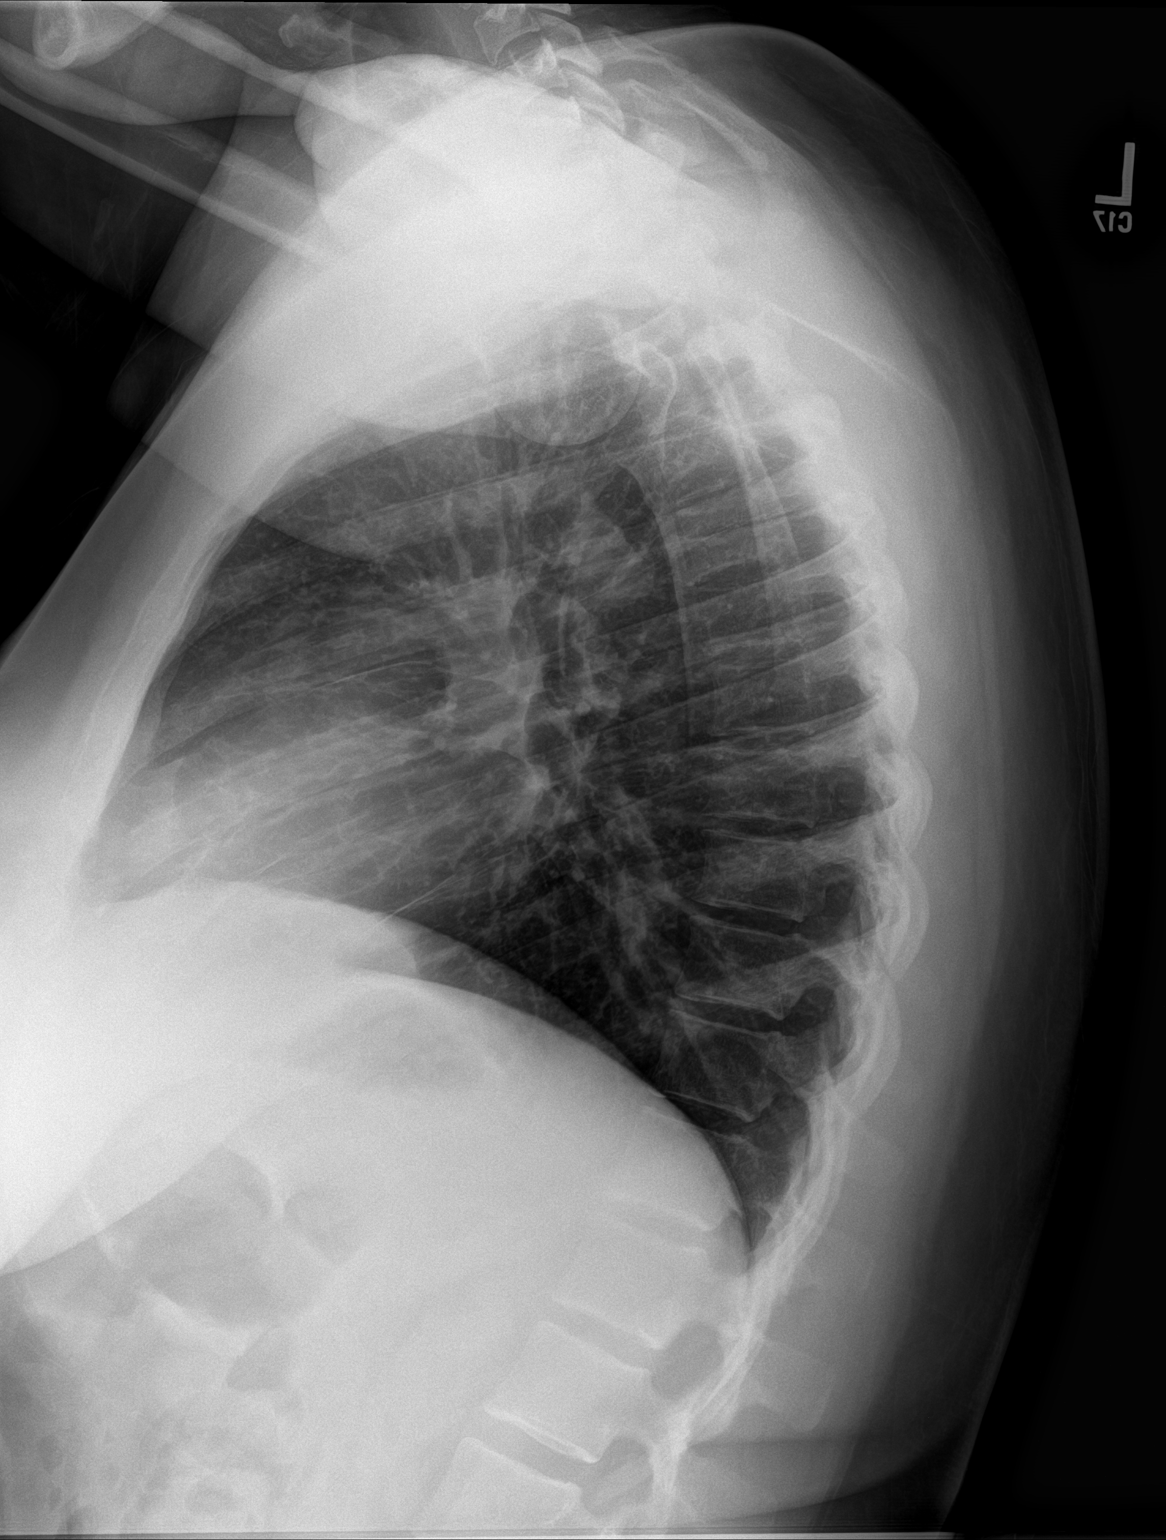

[2 of 2 positions shown; findings below may reference images not displayed]

FINDINGS: No consolidation or effusion. Central airways thickening. Normal
heart size. No pneumothorax
IMPRESSION: 1. No focal pulmonary infiltrate
2. Central airways thickening which may be secondary to airways
inflammatory process or reactive airways disease.

## 2022-01-31 ENCOUNTER — Telehealth: Payer: Self-pay

## 2022-01-31 NOTE — Telephone Encounter (Signed)
Transition Care Management Follow-up Telephone Call Date of discharge and from where: 01/30/2022  [facility] How have you been since you were released from the hospital? Patients that vomiting has stopped still having diarrhea - patient also has rash on face and was advised by the ED that it may be an allergic reaction and to take OTC benadryl - pt has not started medication at this time.  I advised pt to start medication as soon as possible and to push fluids and cont with bland diet as long as she has diarrhea.  Patient was advised to follow up if symptoms do not resolve or if worsen - also discussed with patient red flag symptoms and to seek emergency care as needed Any questions or concerns? No  Items Reviewed: Did the pt receive and understand the discharge instructions provided? Yes  Medications obtained and verified? Yes  Other? No  Any new allergies since your discharge? No  Dietary orders reviewed? Yes Do you have support at home? Yes   Home Care and Equipment/Supplies: Were home health services ordered? not applicable If so, what is the name of the agency? na  Has the agency set up a time to come to the patient's home? not applicable Were any new equipment or medical supplies ordered?  No What is the name of the medical supply agency? na Were you able to get the supplies/equipment? no Do you have any questions related to the use of the equipment or supplies? No  Functional Questionnaire: (I = Independent and D = Dependent) ADLs: I  Bathing/Dressing- I  Meal Prep- I  Eating- I  Maintaining continence- I  Transferring/Ambulation- I  Managing Meds- I  Follow up appointments reviewed:  PCP Hospital f/u appt confirmed? No   Specialist Hospital f/u appt confirmed? No   Are transportation arrangements needed? No  If their condition worsens, is the pt aware to call PCP or go to the Emergency Dept.? Yes Was the patient provided with contact information for the PCP's  office or ED? Yes Was to pt encouraged to call back with questions or concerns? Yes

## 2022-06-24 ENCOUNTER — Telehealth: Payer: Self-pay | Admitting: Family Medicine

## 2022-06-25 NOTE — Telephone Encounter (Signed)
Pt picked up shot record 06/24/22

## 2022-07-17 ENCOUNTER — Encounter: Payer: Self-pay | Admitting: Nurse Practitioner

## 2022-07-17 ENCOUNTER — Ambulatory Visit (INDEPENDENT_AMBULATORY_CARE_PROVIDER_SITE_OTHER): Payer: Medicaid Other | Admitting: Nurse Practitioner

## 2022-07-17 VITALS — BP 117/79 | HR 69 | Temp 97.3°F | Ht 59.0 in | Wt 167.8 lb

## 2022-07-17 DIAGNOSIS — Z020 Encounter for examination for admission to educational institution: Secondary | ICD-10-CM

## 2022-07-17 DIAGNOSIS — Z0001 Encounter for general adult medical examination with abnormal findings: Secondary | ICD-10-CM | POA: Diagnosis not present

## 2022-07-17 DIAGNOSIS — Z7689 Persons encountering health services in other specified circumstances: Secondary | ICD-10-CM

## 2022-07-17 DIAGNOSIS — Z0000 Encounter for general adult medical examination without abnormal findings: Secondary | ICD-10-CM | POA: Insufficient documentation
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
## 2022-09-02 ENCOUNTER — Ambulatory Visit: Payer: Medicaid Other | Admitting: Adult Health

## 2022-10-09 DIAGNOSIS — B002 Herpesviral gingivostomatitis and pharyngotonsillitis: Secondary | ICD-10-CM | POA: Diagnosis not present

## 2022-10-09 DIAGNOSIS — N898 Other specified noninflammatory disorders of vagina: Secondary | ICD-10-CM | POA: Diagnosis not present

## 2022-10-20 ENCOUNTER — Encounter: Payer: Self-pay | Admitting: Adult Health

## 2022-10-20 ENCOUNTER — Ambulatory Visit (INDEPENDENT_AMBULATORY_CARE_PROVIDER_SITE_OTHER): Payer: Medicaid Other | Admitting: Adult Health

## 2022-10-20 ENCOUNTER — Other Ambulatory Visit (HOSPITAL_COMMUNITY)
Admission: RE | Admit: 2022-10-20 | Discharge: 2022-10-20 | Disposition: A | Discharge status: Home / Self Care | Payer: Medicaid Other | Source: Ambulatory Visit | Attending: Adult Health | Admitting: Adult Health

## 2022-10-20 VITALS — BP 127/79 | HR 87 | Ht 59.0 in | Wt 169.0 lb

## 2022-10-20 DIAGNOSIS — Z0000 Encounter for general adult medical examination without abnormal findings: Secondary | ICD-10-CM | POA: Diagnosis not present

## 2022-10-20 DIAGNOSIS — Z01411 Encounter for gynecological examination (general) (routine) with abnormal findings: Secondary | ICD-10-CM

## 2022-10-20 DIAGNOSIS — Z1331 Encounter for screening for depression: Secondary | ICD-10-CM

## 2022-10-20 DIAGNOSIS — Z1151 Encounter for screening for human papillomavirus (HPV): Secondary | ICD-10-CM

## 2022-10-20 DIAGNOSIS — R87610 Atypical squamous cells of undetermined significance on cytologic smear of cervix (ASC-US): Secondary | ICD-10-CM | POA: Diagnosis not present

## 2022-10-20 DIAGNOSIS — Z13220 Encounter for screening for lipoid disorders: Secondary | ICD-10-CM | POA: Diagnosis not present

## 2022-10-20 DIAGNOSIS — Z113 Encounter for screening for infections with a predominantly sexual mode of transmission: Secondary | ICD-10-CM | POA: Diagnosis not present

## 2022-10-20 DIAGNOSIS — Z01419 Encounter for gynecological examination (general) (routine) without abnormal findings: Secondary | ICD-10-CM | POA: Insufficient documentation

## 2022-10-20 NOTE — Progress Notes (Signed)
Patient ID: Leah Carroll, female   DOB: April 30, 2000, 22 y.o.   MRN: [id number] History of Present Illness: Leah Carroll is a 22 year old white female, with SO, G1P1001, in for a well woman gyn exam and pap. She wants labs and STD testing.  PCP is Dr Leah Carroll   Current Medications, Allergies, Past Medical History, Past Surgical History, Family History and Social History were reviewed in [employer] electronic medical record.     Review of Systems: Patient denies any headaches, hearing loss, fatigue, blurred vision, shortness of breath, chest pain, abdominal pain, problems with bowel movements, urination, or intercourse. No joint pain or Carroll swings.  Had yeast recently   Physical Exam:BP 127/79 (BP Location: Left Arm, Patient Position: Sitting, Cuff Size: Normal)   Pulse 87   Ht 4\' 11"  (1.499 m)   Wt 169 lb (76.7 kg)   LMP 10/09/2022   Breastfeeding No   BMI 34.13 kg/m   General:  Well developed, well nourished, no acute distress Skin:  Warm and dry Neck:  Midline trachea, normal thyroid, good ROM, no lymphadenopathy Lungs; Clear to auscultation bilaterally Breast:  No dominant palpable mass, retraction, or nipple discharge Cardiovascular: Regular rate and rhythm Abdomen:  Soft, non tender, no hepatosplenomegaly Pelvic:  External genitalia is normal in appearance, no lesions.  The vagina is normal in appearance. Urethra has no lesions or masses. The cervix is bulbous. Pap with GC/CHL performed. Uterus is felt to be normal size, shape, and contour.  No adnexal masses or tenderness noted.Bladder is non tender, no masses felt. Extremities/musculoskeletal:  No swelling or varicosities noted, no clubbing or cyanosis Psych:  No Carroll changes, alert and cooperative,seems happy AA is 1 Fall risk is low    10/20/2022    3:48 PM 07/31/2020    3:06 PM 10/25/2019    3:51 PM  Depression screen PHQ 2/9  Decreased Interest 0 0 0  Down, Depressed, Hopeless 0 0 0  PHQ - 2 Score 0 0 0   Altered sleeping 0    Tired, decreased energy 0    Change in appetite 0    Feeling bad or failure about yourself  0    Trouble concentrating 0    Moving slowly or fidgety/restless 0    Suicidal thoughts 0    PHQ-9 Score 0         10/20/2022    3:48 PM  GAD 7 : Generalized Anxiety Score  Nervous, Anxious, on Edge 0  Control/stop worrying 0  Worry too much - different things 0  Trouble relaxing 0  Restless 0  Easily annoyed or irritable 0  Afraid - awful might happen 0  Total GAD 7 Score 0      Upstream - 10/20/22 1548       Pregnancy Intention Screening   Does the patient want to become pregnant in the next year? Yes    Does the patient's partner want to become pregnant in the next year? Yes    Would the patient like to discuss contraceptive options today? No      Contraception Wrap Up   Current Method Pregnant/Seeking Pregnancy    End Method Pregnant/Seeking Pregnancy            Examination chaperoned by Leah Mood LPN   Impression and Plan: 1. Routine general medical examination at a health care facility Pap sent Pap in 3 years if normal Physical in 1 year Check labs  - Cytology - PAP( [facility]) - CBC -  Comprehensive metabolic panel - Lipid panel Discussed timing of sex and get SO to consider semen analysis, he may have one child or not  Take PNV  2. Encounter for gynecological examination with Papanicolaou smear of cervix Pap sent   3. Screening cholesterol level - Lipid panel  4. Screening examination for STD (sexually transmitted disease) - HIV Antibody (routine testing w rflx) - RPR

## 2022-10-21 LAB — LIPID PANEL
Chol/HDL Ratio: 3.4 ratio (ref 0.0–4.4)
Cholesterol, Total: 111 mg/dL (ref 100–199)
HDL: 33 mg/dL — ABNORMAL LOW (ref >39)
LDL Chol Calc (NIH): 67 mg/dL (ref 0–99)
Triglycerides: 47 mg/dL (ref 0–149)
VLDL Cholesterol Cal: 11 mg/dL (ref 5–40)

## 2022-10-21 LAB — CBC
Hematocrit: 40.8 % (ref 34.0–46.6)
Hemoglobin: 13.6 g/dL (ref 11.1–15.9)
MCH: 30.2 pg (ref 26.6–33.0)
MCHC: 33.3 g/dL (ref 31.5–35.7)
MCV: 91 fL (ref 79–97)
Platelets: 290 10*3/uL (ref 150–450)
RBC: 4.51 x10E6/uL (ref 3.77–5.28)
RDW: 11.8 % (ref 11.7–15.4)
WBC: 6.2 10*3/uL (ref 3.4–10.8)

## 2022-10-21 LAB — COMPREHENSIVE METABOLIC PANEL
ALT: 12 IU/L (ref 0–32)
AST: 13 IU/L (ref 0–40)
Albumin: 4.5 g/dL (ref 4.0–5.0)
Alkaline Phosphatase: 73 IU/L (ref 44–121)
BUN/Creatinine Ratio: 15 (ref 9–23)
BUN: 11 mg/dL (ref 6–20)
Bilirubin Total: 0.4 mg/dL (ref 0.0–1.2)
CO2: 25 mmol/L (ref 20–29)
Calcium: 9.7 mg/dL (ref 8.7–10.2)
Chloride: 103 mmol/L (ref 96–106)
Creatinine, Ser: 0.71 mg/dL (ref 0.57–1.00)
Globulin, Total: 2.7 g/dL (ref 1.5–4.5)
Glucose: 91 mg/dL (ref 70–99)
Potassium: 4.2 mmol/L (ref 3.5–5.2)
Sodium: 140 mmol/L (ref 134–144)
Total Protein: 7.2 g/dL (ref 6.0–8.5)
eGFR: 122 mL/min/{1.73_m2} (ref >59)

## 2022-10-21 LAB — HIV ANTIBODY (ROUTINE TESTING W REFLEX): HIV Screen 4th Generation wRfx: NONREACTIVE

## 2022-10-21 LAB — RPR: RPR Ser Ql: NONREACTIVE

## 2022-10-24 ENCOUNTER — Encounter: Payer: Self-pay | Admitting: Adult Health

## 2022-10-24 DIAGNOSIS — R87610 Atypical squamous cells of undetermined significance on cytologic smear of cervix (ASC-US): Secondary | ICD-10-CM | POA: Insufficient documentation

## 2022-10-24 LAB — CYTOLOGY - PAP
Chlamydia: NEGATIVE
Comment: NEGATIVE
Comment: NEGATIVE
Comment: NORMAL
Diagnosis: UNDETERMINED — AB
High risk HPV: POSITIVE — AB
Neisseria Gonorrhea: NEGATIVE

## 2022-11-17 ENCOUNTER — Other Ambulatory Visit: Payer: Self-pay | Admitting: Adult Health

## 2022-11-17 MED ORDER — FLUCONAZOLE 150 MG PO TABS: ORAL_TABLET | ORAL | 1 refills | Status: DC

## 2022-11-17 NOTE — Progress Notes (Signed)
Rx diflucan.  

## 2023-02-20 ENCOUNTER — Other Ambulatory Visit: Payer: Self-pay | Admitting: Adult Health

## 2023-05-20 DIAGNOSIS — G44209 Tension-type headache, unspecified, not intractable: Secondary | ICD-10-CM | POA: Diagnosis not present

## 2023-05-20 DIAGNOSIS — R0602 Shortness of breath: Secondary | ICD-10-CM | POA: Diagnosis not present

## 2023-07-21 ENCOUNTER — Other Ambulatory Visit: Payer: Self-pay | Admitting: Adult Health

## 2023-11-25 ENCOUNTER — Encounter: Payer: Self-pay | Admitting: Adult Health

## 2023-11-25 ENCOUNTER — Ambulatory Visit (INDEPENDENT_AMBULATORY_CARE_PROVIDER_SITE_OTHER): Payer: Self-pay | Admitting: Adult Health

## 2023-11-25 ENCOUNTER — Ambulatory Visit

## 2023-11-25 ENCOUNTER — Ambulatory Visit: Admitting: Adult Health

## 2023-11-25 ENCOUNTER — Other Ambulatory Visit (HOSPITAL_COMMUNITY)
Admission: RE | Admit: 2023-11-25 | Discharge: 2023-11-25 | Disposition: A | Discharge status: Home / Self Care | Payer: Self-pay | Source: Ambulatory Visit | Attending: Adult Health | Admitting: Adult Health

## 2023-11-25 ENCOUNTER — Ambulatory Visit: Payer: Self-pay | Admitting: Adult Health

## 2023-11-25 VITALS — BP 122/76 | HR 70 | Ht 59.0 in | Wt 171.5 lb

## 2023-11-25 DIAGNOSIS — N898 Other specified noninflammatory disorders of vagina: Secondary | ICD-10-CM

## 2023-11-25 DIAGNOSIS — B379 Candidiasis, unspecified: Secondary | ICD-10-CM

## 2023-11-25 DIAGNOSIS — Z8742 Personal history of other diseases of the female genital tract: Secondary | ICD-10-CM

## 2023-11-25 DIAGNOSIS — Z124 Encounter for screening for malignant neoplasm of cervix: Secondary | ICD-10-CM

## 2023-11-25 LAB — POCT WET PREP (WET MOUNT)

## 2023-11-25 MED ORDER — FLUCONAZOLE 150 MG PO TABS: ORAL_TABLET | ORAL | 2 refills | Status: AC

## 2023-11-25 NOTE — Progress Notes (Signed)
  Subjective:     Patient ID: Leah Carroll, female   DOB: 04-Aug-2000, 23 y.o.   MRN: [id number]  HPI Leah Carroll is a 23 year old white female, with SO, G1P1001 in complaining of vaginal irritation, itching and white cottage cheese like discharge. And she needs a pap.     Component Value Date/Time   DIAGPAP (A) 10/20/2022 1549    - Atypical squamous cells of undetermined significance (ASC-US )   HPVHIGH Positive (A) 10/20/2022 1549   ADEQPAP  10/20/2022 1549    Satisfactory for evaluation; transformation zone component PRESENT.   PCP is Dr Jolinda  Review of Systems  +vaginal irritation, itching and white cottage cheese like discharge.  No new partners or products Reviewed past medical,surgical, social and family history. Reviewed medications and allergies.     Objective:   Physical Exam BP 122/76 (BP Location: Right Arm, Patient Position: Sitting, Cuff Size: Normal)   Pulse 70   Ht 4' 11 (1.499 m)   Wt 171 lb 8 oz (77.8 kg)   LMP 11/10/2023 (Exact Date)   BMI 34.64 kg/m     Skin warm and dry.Pelvic: external genitalia is normal in appearance no lesions, vagina: white discharge without odor,urethra has no lesions or masses noted, cervix: bulbous, pap with GC/CHL and HPV genotyping performed, uterus: normal size, shape and contour, non tender, no masses felt, adnexa: no masses or tenderness noted. Bladder is non tender and no masses felt. Wet prep: + for Yeast and +WBCs. Fall risk is low  Upstream - 11/25/23 1342       Pregnancy Intention Screening   Does the patient want to become pregnant in the next year? No    Does the patient's partner want to become pregnant in the next year? No    Would the patient like to discuss contraceptive options today? No      Contraception Wrap Up   Current Method No Method - Other Reason    End Method No Method - Other Reason         Examination chaperoned by Clarita Salt LPN Assessment:     1. Routine Papanicolaou smear Pap sent   Next pap TBD  - Cytology - PAP( [facility])  2. Vaginal irritation - POCT Wet Prep ([facility])  3. Vaginal discharge - POCT Wet Prep [facility])  4. Yeast infection (Primary) + yeast on wet prep Will rx diflucan  Meds ordered this encounter  Medications   fluconazole  (DIFLUCAN ) 150 MG tablet    Sig: Take 1 now and 1 in 3 days if needed    Dispense:  2 tablet    Refill:  2    Supervising Provider:   JAYNE MINDER H [[id number]]    - POCT Wet Prep [facility])  5. History of abnormal cervical Pap smear Pap sent     Plan:     Follow up prn

## 2023-12-01 LAB — CYTOLOGY - PAP
Chlamydia: NEGATIVE
Comment: NEGATIVE
Comment: NEGATIVE
Comment: NORMAL
Diagnosis: NEGATIVE
High risk HPV: NEGATIVE
Neisseria Gonorrhea: NEGATIVE

## 2023-12-02 ENCOUNTER — Ambulatory Visit: Payer: Self-pay | Admitting: Adult Health

## 2023-12-25 ENCOUNTER — Telehealth: Payer: Self-pay | Admitting: Nurse Practitioner

## 2023-12-25 NOTE — Telephone Encounter (Unsigned)
 Copied from CRM [id number]. Topic: Clinical - Medical Advice >> Dec 25, 2023  9:52 AM Tinnie BROCKS wrote: Reason for CRM: Pt having bad allergies. Was prescribed zyrtec  in the past with us  for allergies and she has since been getting it over the counter, but she says that and xyzal are providing no relief. She is wondering if anything else can be called in for her. She says she would not like to come in to talk about it because she is uninsured.   [facility] - [place], [place] - [address] [place] [address] Phone: [phone] Fax: [phone]   She says you can reply through mychart.

## 2024-02-22 ENCOUNTER — Ambulatory Visit: Admitting: Adult Health

## 2024-03-15 ENCOUNTER — Other Ambulatory Visit: Payer: Self-pay | Admitting: Adult Health

## 2024-03-15 MED ORDER — METRONIDAZOLE 0.75 % VA GEL: 1.0000 | Freq: Every day | VAGINAL | 0 refills | Status: AC

## 2024-03-15 NOTE — Progress Notes (Signed)
Rx metrogel
